# Patient Record
Sex: Female | Born: 1980 | Race: Black or African American | Hispanic: No | Marital: Single | Smoking: Current every day smoker
Health system: Southern US, Community
[De-identification: ages and names within clinical notes are randomized; demographics above are authoritative.]

## PROBLEM LIST (undated history)

## (undated) DIAGNOSIS — E039 Hypothyroidism, unspecified: Secondary | ICD-10-CM

## (undated) DIAGNOSIS — A599 Trichomoniasis, unspecified: Secondary | ICD-10-CM

## (undated) DIAGNOSIS — K8020 Calculus of gallbladder without cholecystitis without obstruction: Secondary | ICD-10-CM

## (undated) DIAGNOSIS — D649 Anemia, unspecified: Secondary | ICD-10-CM

## (undated) DIAGNOSIS — I10 Essential (primary) hypertension: Secondary | ICD-10-CM

## (undated) DIAGNOSIS — D539 Nutritional anemia, unspecified: Secondary | ICD-10-CM

## (undated) DIAGNOSIS — A749 Chlamydial infection, unspecified: Secondary | ICD-10-CM

## (undated) DIAGNOSIS — F1020 Alcohol dependence, uncomplicated: Secondary | ICD-10-CM

## (undated) DIAGNOSIS — A549 Gonococcal infection, unspecified: Secondary | ICD-10-CM

## (undated) DIAGNOSIS — N83209 Unspecified ovarian cyst, unspecified side: Secondary | ICD-10-CM

## (undated) DIAGNOSIS — K859 Acute pancreatitis without necrosis or infection, unspecified: Secondary | ICD-10-CM

## (undated) DIAGNOSIS — IMO0002 Reserved for concepts with insufficient information to code with codable children: Secondary | ICD-10-CM

## (undated) DIAGNOSIS — R87619 Unspecified abnormal cytological findings in specimens from cervix uteri: Secondary | ICD-10-CM

## (undated) DIAGNOSIS — K760 Fatty (change of) liver, not elsewhere classified: Secondary | ICD-10-CM

## (undated) HISTORY — DX: Calculus of gallbladder without cholecystitis without obstruction: K80.20

## (undated) HISTORY — DX: Fatty (change of) liver, not elsewhere classified: K76.0

## (undated) HISTORY — DX: Nutritional anemia, unspecified: D53.9

## (undated) HISTORY — DX: Alcohol dependence, uncomplicated: F10.20

## (undated) HISTORY — DX: Reserved for concepts with insufficient information to code with codable children: IMO0002

## (undated) HISTORY — DX: Acute pancreatitis without necrosis or infection, unspecified: K85.90

## (undated) HISTORY — DX: Hypothyroidism, unspecified: E03.9

## (undated) HISTORY — DX: Anemia, unspecified: D64.9

## (undated) HISTORY — PX: WISDOM TOOTH EXTRACTION: SHX21

## (undated) HISTORY — DX: Unspecified abnormal cytological findings in specimens from cervix uteri: R87.619

---

## 1999-04-25 ENCOUNTER — Inpatient Hospital Stay (HOSPITAL_COMMUNITY): Admission: AD | Admit: 1999-04-25 | Discharge: 1999-04-25 | Payer: Self-pay | Admitting: *Deleted

## 1999-04-29 ENCOUNTER — Inpatient Hospital Stay (HOSPITAL_COMMUNITY): Admission: AD | Admit: 1999-04-29 | Discharge: 1999-04-29 | Payer: Self-pay | Admitting: Obstetrics & Gynecology

## 1999-04-29 ENCOUNTER — Encounter: Payer: Self-pay | Admitting: *Deleted

## 1999-12-01 ENCOUNTER — Inpatient Hospital Stay (HOSPITAL_COMMUNITY): Admission: AD | Admit: 1999-12-01 | Discharge: 1999-12-01 | Payer: Self-pay | Admitting: *Deleted

## 2000-05-19 ENCOUNTER — Emergency Department (HOSPITAL_COMMUNITY): Admission: EM | Admit: 2000-05-19 | Discharge: 2000-05-19 | Payer: Self-pay | Admitting: Emergency Medicine

## 2000-07-08 ENCOUNTER — Emergency Department (HOSPITAL_COMMUNITY): Admission: EM | Admit: 2000-07-08 | Discharge: 2000-07-08 | Payer: Self-pay | Admitting: Emergency Medicine

## 2000-07-30 ENCOUNTER — Emergency Department (HOSPITAL_COMMUNITY): Admission: EM | Admit: 2000-07-30 | Discharge: 2000-07-30 | Payer: Self-pay

## 2000-07-30 ENCOUNTER — Encounter: Payer: Self-pay | Admitting: Emergency Medicine

## 2000-09-25 ENCOUNTER — Emergency Department (HOSPITAL_COMMUNITY): Admission: EM | Admit: 2000-09-25 | Discharge: 2000-09-25 | Payer: Self-pay | Admitting: Emergency Medicine

## 2000-09-25 ENCOUNTER — Encounter: Payer: Self-pay | Admitting: Emergency Medicine

## 2001-05-19 ENCOUNTER — Emergency Department (HOSPITAL_COMMUNITY): Admission: EM | Admit: 2001-05-19 | Discharge: 2001-05-19 | Payer: Self-pay | Admitting: Emergency Medicine

## 2001-05-19 ENCOUNTER — Encounter: Payer: Self-pay | Admitting: Emergency Medicine

## 2001-09-08 ENCOUNTER — Emergency Department (HOSPITAL_COMMUNITY): Admission: EM | Admit: 2001-09-08 | Discharge: 2001-09-08 | Payer: Self-pay | Admitting: Emergency Medicine

## 2001-09-13 ENCOUNTER — Emergency Department (HOSPITAL_COMMUNITY): Admission: EM | Admit: 2001-09-13 | Discharge: 2001-09-13 | Payer: Self-pay | Admitting: Emergency Medicine

## 2001-09-13 ENCOUNTER — Encounter: Payer: Self-pay | Admitting: Emergency Medicine

## 2001-09-16 ENCOUNTER — Encounter: Admission: RE | Admit: 2001-09-16 | Discharge: 2001-09-16 | Payer: Self-pay | Admitting: Internal Medicine

## 2001-10-25 ENCOUNTER — Emergency Department (HOSPITAL_COMMUNITY): Admission: EM | Admit: 2001-10-25 | Discharge: 2001-10-25 | Payer: Self-pay | Admitting: Emergency Medicine

## 2001-10-31 ENCOUNTER — Emergency Department (HOSPITAL_COMMUNITY): Admission: EM | Admit: 2001-10-31 | Discharge: 2001-10-31 | Payer: Self-pay | Admitting: Emergency Medicine

## 2002-03-30 ENCOUNTER — Emergency Department (HOSPITAL_COMMUNITY): Admission: EM | Admit: 2002-03-30 | Discharge: 2002-03-30 | Payer: Self-pay | Admitting: Emergency Medicine

## 2002-04-05 ENCOUNTER — Emergency Department (HOSPITAL_COMMUNITY): Admission: EM | Admit: 2002-04-05 | Discharge: 2002-04-05 | Payer: Self-pay | Admitting: Emergency Medicine

## 2002-08-19 ENCOUNTER — Emergency Department (HOSPITAL_COMMUNITY): Admission: EM | Admit: 2002-08-19 | Discharge: 2002-08-19 | Payer: Self-pay | Admitting: Emergency Medicine

## 2002-08-21 ENCOUNTER — Emergency Department (HOSPITAL_COMMUNITY): Admission: EM | Admit: 2002-08-21 | Discharge: 2002-08-21 | Payer: Self-pay | Admitting: Emergency Medicine

## 2003-03-07 ENCOUNTER — Emergency Department (HOSPITAL_COMMUNITY): Admission: EM | Admit: 2003-03-07 | Discharge: 2003-03-07 | Payer: Self-pay | Admitting: Emergency Medicine

## 2003-06-09 ENCOUNTER — Emergency Department (HOSPITAL_COMMUNITY): Admission: EM | Admit: 2003-06-09 | Discharge: 2003-06-09 | Payer: Self-pay | Admitting: Emergency Medicine

## 2003-06-10 ENCOUNTER — Emergency Department (HOSPITAL_COMMUNITY): Admission: EM | Admit: 2003-06-10 | Discharge: 2003-06-10 | Payer: Self-pay | Admitting: Emergency Medicine

## 2003-06-29 ENCOUNTER — Emergency Department (HOSPITAL_COMMUNITY): Admission: EM | Admit: 2003-06-29 | Discharge: 2003-06-29 | Payer: Self-pay | Admitting: Emergency Medicine

## 2003-08-22 ENCOUNTER — Emergency Department (HOSPITAL_COMMUNITY): Admission: EM | Admit: 2003-08-22 | Discharge: 2003-08-22 | Payer: Self-pay | Admitting: Emergency Medicine

## 2003-09-28 ENCOUNTER — Emergency Department (HOSPITAL_COMMUNITY): Admission: EM | Admit: 2003-09-28 | Discharge: 2003-09-28 | Payer: Self-pay | Admitting: Emergency Medicine

## 2003-11-30 ENCOUNTER — Encounter (HOSPITAL_COMMUNITY): Admission: RE | Admit: 2003-11-30 | Discharge: 2004-02-28 | Payer: Self-pay | Admitting: Endocrinology

## 2003-12-08 ENCOUNTER — Emergency Department (HOSPITAL_COMMUNITY): Admission: EM | Admit: 2003-12-08 | Discharge: 2003-12-08 | Payer: Self-pay | Admitting: Emergency Medicine

## 2004-03-14 ENCOUNTER — Emergency Department (HOSPITAL_COMMUNITY): Admission: EM | Admit: 2004-03-14 | Discharge: 2004-03-14 | Payer: Self-pay | Admitting: Emergency Medicine

## 2004-10-16 ENCOUNTER — Emergency Department (HOSPITAL_COMMUNITY): Admission: EM | Admit: 2004-10-16 | Discharge: 2004-10-16 | Payer: Self-pay | Admitting: Emergency Medicine

## 2004-11-13 ENCOUNTER — Emergency Department (HOSPITAL_COMMUNITY): Admission: EM | Admit: 2004-11-13 | Discharge: 2004-11-13 | Payer: Self-pay | Admitting: Emergency Medicine

## 2007-03-08 ENCOUNTER — Emergency Department (HOSPITAL_COMMUNITY): Admission: EM | Admit: 2007-03-08 | Discharge: 2007-03-08 | Payer: Self-pay | Admitting: Emergency Medicine

## 2009-01-29 ENCOUNTER — Ambulatory Visit (HOSPITAL_COMMUNITY): Admission: RE | Admit: 2009-01-29 | Discharge: 2009-01-29 | Payer: Self-pay | Admitting: Obstetrics & Gynecology

## 2009-02-22 ENCOUNTER — Ambulatory Visit: Payer: Self-pay | Admitting: Obstetrics & Gynecology

## 2009-02-22 LAB — CONVERTED CEMR LAB: Chlamydia, DNA Probe: POSITIVE — AB

## 2009-03-08 ENCOUNTER — Ambulatory Visit: Payer: Self-pay | Admitting: Obstetrics & Gynecology

## 2009-03-22 ENCOUNTER — Encounter: Payer: Self-pay | Admitting: Obstetrics & Gynecology

## 2009-03-22 ENCOUNTER — Ambulatory Visit: Payer: Self-pay | Admitting: Family Medicine

## 2009-03-22 LAB — CONVERTED CEMR LAB
Chlamydia, DNA Probe: NEGATIVE
GC Probe Amp, Genital: NEGATIVE
HCT: 31.8 % — ABNORMAL LOW (ref 36.0–46.0)
Hemoglobin: 10.6 g/dL — ABNORMAL LOW (ref 12.0–15.0)
MCHC: 33.3 g/dL (ref 30.0–36.0)
MCV: 91.9 fL (ref 78.0–100.0)
Platelets: 171 10*3/uL (ref 150–400)
RBC: 3.46 M/uL — ABNORMAL LOW (ref 3.87–5.11)
RDW: 13.6 % (ref 11.5–15.5)
TSH: 4.243 microintl units/mL (ref 0.350–4.500)
WBC: 7.7 10*3/uL (ref 4.0–10.5)

## 2009-04-05 ENCOUNTER — Ambulatory Visit: Payer: Self-pay | Admitting: Obstetrics & Gynecology

## 2009-04-19 ENCOUNTER — Ambulatory Visit: Payer: Self-pay | Admitting: Obstetrics and Gynecology

## 2009-04-19 LAB — CONVERTED CEMR LAB: TSH: 2.791 microintl units/mL (ref 0.350–4.500)

## 2009-05-03 ENCOUNTER — Ambulatory Visit: Payer: Self-pay | Admitting: Family Medicine

## 2009-05-17 ENCOUNTER — Ambulatory Visit: Payer: Self-pay | Admitting: Obstetrics & Gynecology

## 2009-05-17 LAB — CONVERTED CEMR LAB
Chlamydia, DNA Probe: NEGATIVE
GC Probe Amp, Genital: NEGATIVE

## 2009-05-18 ENCOUNTER — Encounter: Payer: Self-pay | Admitting: Obstetrics & Gynecology

## 2009-05-18 ENCOUNTER — Ambulatory Visit (HOSPITAL_COMMUNITY): Admission: RE | Admit: 2009-05-18 | Discharge: 2009-05-18 | Payer: Self-pay | Admitting: Obstetrics & Gynecology

## 2009-05-24 ENCOUNTER — Ambulatory Visit: Payer: Self-pay | Admitting: Obstetrics & Gynecology

## 2009-05-31 ENCOUNTER — Ambulatory Visit: Payer: Self-pay | Admitting: Obstetrics and Gynecology

## 2009-06-02 ENCOUNTER — Ambulatory Visit: Payer: Self-pay | Admitting: Advanced Practice Midwife

## 2010-02-14 ENCOUNTER — Inpatient Hospital Stay (HOSPITAL_COMMUNITY): Admission: AD | Admit: 2010-02-14 | Discharge: 2009-06-04 | Payer: Self-pay | Admitting: Obstetrics & Gynecology

## 2010-03-10 NOTE — L&D Delivery Note (Cosign Needed)
Delivery Note At 3:01 AM a viable female infant was delivered via Vaginal, Spontaneous Delivery (Presentation: Right Occiput Anterior).  APGAR: 8, 9; weight 5# 15oz .   Placenta status: Intact, Spontaneous.  Cord: 3 vessels with the following complications: None.    Anesthesia: Epidural  Episiotomy: None Lacerations: Intact Suture Repair: N/A Est. Blood Loss (mL): 250  Mom to postpartum.  Baby to nursery-stable.  Philipp Deputy, CNM, present for delivery.  Sharen Counter 10/30/2010, 3:17 AM

## 2010-03-24 ENCOUNTER — Inpatient Hospital Stay (HOSPITAL_COMMUNITY)
Admission: AD | Admit: 2010-03-24 | Discharge: 2010-03-24 | Payer: Self-pay | Source: Home / Self Care | Attending: Family Medicine | Admitting: Family Medicine

## 2010-03-25 LAB — WET PREP, GENITAL
Clue Cells Wet Prep HPF POC: NONE SEEN
Trich, Wet Prep: NONE SEEN
Yeast Wet Prep HPF POC: NONE SEEN

## 2010-03-25 LAB — CBC
HCT: 32.9 % — ABNORMAL LOW (ref 36.0–46.0)
Hemoglobin: 10.8 g/dL — ABNORMAL LOW (ref 12.0–15.0)
MCH: 26.5 pg (ref 26.0–34.0)
MCHC: 32.8 g/dL (ref 30.0–36.0)
MCV: 80.6 fL (ref 78.0–100.0)
Platelets: 236 10*3/uL (ref 150–400)
RBC: 4.08 MIL/uL (ref 3.87–5.11)
RDW: 15.5 % (ref 11.5–15.5)
WBC: 6.8 10*3/uL (ref 4.0–10.5)

## 2010-03-25 LAB — DIFFERENTIAL
Basophils Absolute: 0 10*3/uL (ref 0.0–0.1)
Basophils Relative: 0 % (ref 0–1)
Eosinophils Absolute: 0.2 10*3/uL (ref 0.0–0.7)
Eosinophils Relative: 3 % (ref 0–5)
Lymphocytes Relative: 24 % (ref 12–46)
Lymphs Abs: 1.6 10*3/uL (ref 0.7–4.0)
Monocytes Absolute: 0.6 10*3/uL (ref 0.1–1.0)
Monocytes Relative: 9 % (ref 3–12)
Neutro Abs: 4.4 10*3/uL (ref 1.7–7.7)
Neutrophils Relative %: 65 % (ref 43–77)

## 2010-03-25 LAB — URINALYSIS, ROUTINE W REFLEX MICROSCOPIC
Bilirubin Urine: NEGATIVE
Hgb urine dipstick: NEGATIVE
Ketones, ur: 15 mg/dL — AB
Nitrite: NEGATIVE
Protein, ur: NEGATIVE mg/dL
Specific Gravity, Urine: 1.015 (ref 1.005–1.030)
Urine Glucose, Fasting: NEGATIVE mg/dL
Urobilinogen, UA: 1 mg/dL (ref 0.0–1.0)
pH: 6.5 (ref 5.0–8.0)

## 2010-03-25 LAB — POCT PREGNANCY, URINE: Preg Test, Ur: POSITIVE

## 2010-03-27 LAB — GC/CHLAMYDIA PROBE AMP, GENITAL
Chlamydia, DNA Probe: NEGATIVE
GC Probe Amp, Genital: NEGATIVE

## 2010-05-13 ENCOUNTER — Other Ambulatory Visit: Payer: Self-pay | Admitting: Family Medicine

## 2010-05-13 DIAGNOSIS — F17200 Nicotine dependence, unspecified, uncomplicated: Secondary | ICD-10-CM

## 2010-05-13 DIAGNOSIS — Z3689 Encounter for other specified antenatal screening: Secondary | ICD-10-CM

## 2010-05-23 ENCOUNTER — Other Ambulatory Visit: Payer: Self-pay | Admitting: Family Medicine

## 2010-05-23 ENCOUNTER — Ambulatory Visit (HOSPITAL_COMMUNITY)
Admission: RE | Admit: 2010-05-23 | Discharge: 2010-05-23 | Disposition: A | Discharge status: Home / Self Care | Payer: Medicaid Other | Source: Ambulatory Visit | Attending: Obstetrics & Gynecology | Admitting: Obstetrics & Gynecology

## 2010-05-23 ENCOUNTER — Other Ambulatory Visit: Payer: Self-pay

## 2010-05-23 DIAGNOSIS — M899 Disorder of bone, unspecified: Secondary | ICD-10-CM

## 2010-05-23 DIAGNOSIS — F17200 Nicotine dependence, unspecified, uncomplicated: Secondary | ICD-10-CM

## 2010-05-23 DIAGNOSIS — M259 Joint disorder, unspecified: Secondary | ICD-10-CM

## 2010-05-23 DIAGNOSIS — O9989 Other specified diseases and conditions complicating pregnancy, childbirth and the puerperium: Secondary | ICD-10-CM

## 2010-05-23 DIAGNOSIS — Z3689 Encounter for other specified antenatal screening: Secondary | ICD-10-CM

## 2010-05-23 LAB — HIV ANTIBODY (ROUTINE TESTING W REFLEX): HIV: NONREACTIVE

## 2010-05-23 LAB — ANTIBODY SCREEN: Antibody Screen: NEGATIVE

## 2010-05-23 LAB — POCT URINALYSIS DIPSTICK
Bilirubin Urine: NEGATIVE
Glucose, UA: NEGATIVE mg/dL
Hgb urine dipstick: NEGATIVE
Ketones, ur: NEGATIVE mg/dL
Nitrite: NEGATIVE
Protein, ur: NEGATIVE mg/dL
Specific Gravity, Urine: 1.025 (ref 1.005–1.030)
Urobilinogen, UA: 0.2 mg/dL (ref 0.0–1.0)
pH: 6.5 (ref 5.0–8.0)

## 2010-05-23 LAB — HEPATITIS B SURFACE ANTIGEN: Hepatitis B Surface Ag: NEGATIVE

## 2010-05-26 LAB — POCT URINALYSIS DIP (DEVICE)
Bilirubin Urine: NEGATIVE
Glucose, UA: NEGATIVE mg/dL
Hgb urine dipstick: NEGATIVE
Ketones, ur: NEGATIVE mg/dL
Nitrite: NEGATIVE
Protein, ur: NEGATIVE mg/dL
Specific Gravity, Urine: 1.02 (ref 1.005–1.030)
Urobilinogen, UA: 0.2 mg/dL (ref 0.0–1.0)
pH: 6.5 (ref 5.0–8.0)

## 2010-05-27 LAB — POCT URINALYSIS DIP (DEVICE)
Bilirubin Urine: NEGATIVE
Glucose, UA: NEGATIVE mg/dL
Hgb urine dipstick: NEGATIVE
Ketones, ur: NEGATIVE mg/dL
Nitrite: NEGATIVE
Protein, ur: NEGATIVE mg/dL
Specific Gravity, Urine: 1.015 (ref 1.005–1.030)
Urobilinogen, UA: 0.2 mg/dL (ref 0.0–1.0)
pH: 6.5 (ref 5.0–8.0)

## 2010-05-30 ENCOUNTER — Inpatient Hospital Stay (HOSPITAL_COMMUNITY)
Admission: AD | Admit: 2010-05-30 | Discharge: 2010-05-30 | Disposition: A | Discharge status: Home / Self Care | Payer: Medicaid Other | Source: Ambulatory Visit | Attending: Obstetrics and Gynecology | Admitting: Obstetrics and Gynecology

## 2010-05-30 DIAGNOSIS — K5289 Other specified noninfective gastroenteritis and colitis: Secondary | ICD-10-CM | POA: Insufficient documentation

## 2010-05-30 DIAGNOSIS — O9989 Other specified diseases and conditions complicating pregnancy, childbirth and the puerperium: Secondary | ICD-10-CM

## 2010-05-30 DIAGNOSIS — O212 Late vomiting of pregnancy: Secondary | ICD-10-CM | POA: Insufficient documentation

## 2010-05-30 DIAGNOSIS — O99891 Other specified diseases and conditions complicating pregnancy: Secondary | ICD-10-CM | POA: Insufficient documentation

## 2010-05-30 LAB — URINALYSIS, ROUTINE W REFLEX MICROSCOPIC
Bilirubin Urine: NEGATIVE
Glucose, UA: NEGATIVE mg/dL
Hgb urine dipstick: NEGATIVE
Ketones, ur: 40 mg/dL — AB
Nitrite: NEGATIVE
Protein, ur: NEGATIVE mg/dL
Specific Gravity, Urine: >1.03 — ABNORMAL HIGH (ref 1.005–1.030)
Urobilinogen, UA: 0.2 mg/dL (ref 0.0–1.0)
pH: 6 (ref 5.0–8.0)

## 2010-05-30 LAB — CBC
HCT: 32.7 % — ABNORMAL LOW (ref 36.0–46.0)
Hemoglobin: 10.9 g/dL — ABNORMAL LOW (ref 12.0–15.0)
MCH: 29 pg (ref 26.0–34.0)
MCHC: 33.3 g/dL (ref 30.0–36.0)
MCV: 87 fL (ref 78.0–100.0)
Platelets: 183 10*3/uL (ref 150–400)
RBC: 3.76 MIL/uL — ABNORMAL LOW (ref 3.87–5.11)
RDW: 16.8 % — ABNORMAL HIGH (ref 11.5–15.5)
WBC: 10 10*3/uL (ref 4.0–10.5)

## 2010-05-30 LAB — COMPREHENSIVE METABOLIC PANEL
ALT: 30 U/L (ref 0–35)
AST: 26 U/L (ref 0–37)
Albumin: 3.6 g/dL (ref 3.5–5.2)
Alkaline Phosphatase: 73 U/L (ref 39–117)
BUN: 9 mg/dL (ref 6–23)
CO2: 23 mEq/L (ref 19–32)
Calcium: 8.8 mg/dL (ref 8.4–10.5)
Chloride: 103 mEq/L (ref 96–112)
Creatinine, Ser: 0.7 mg/dL (ref 0.4–1.2)
GFR calc Af Amer: >60 mL/min (ref >60)
GFR calc non Af Amer: >60 mL/min (ref >60)
Glucose, Bld: 96 mg/dL (ref 70–99)
Potassium: 3.6 mEq/L (ref 3.5–5.1)
Sodium: 133 mEq/L — ABNORMAL LOW (ref 135–145)
Total Bilirubin: 0.2 mg/dL — ABNORMAL LOW (ref 0.3–1.2)
Total Protein: 7.1 g/dL (ref 6.0–8.3)

## 2010-05-31 LAB — POCT URINALYSIS DIP (DEVICE)
Bilirubin Urine: NEGATIVE
Bilirubin Urine: NEGATIVE
Glucose, UA: NEGATIVE mg/dL
Glucose, UA: NEGATIVE mg/dL
Hgb urine dipstick: NEGATIVE
Hgb urine dipstick: NEGATIVE
Ketones, ur: NEGATIVE mg/dL
Ketones, ur: NEGATIVE mg/dL
Nitrite: NEGATIVE
Nitrite: NEGATIVE
Protein, ur: NEGATIVE mg/dL
Protein, ur: NEGATIVE mg/dL
Specific Gravity, Urine: 1.02 (ref 1.005–1.030)
Specific Gravity, Urine: 1.02 (ref 1.005–1.030)
Urobilinogen, UA: 0.2 mg/dL (ref 0.0–1.0)
Urobilinogen, UA: 0.2 mg/dL (ref 0.0–1.0)
pH: 6 (ref 5.0–8.0)
pH: 6 (ref 5.0–8.0)

## 2010-06-03 ENCOUNTER — Ambulatory Visit (HOSPITAL_COMMUNITY): Payer: Medicaid Other

## 2010-06-03 LAB — POCT URINALYSIS DIP (DEVICE)
Bilirubin Urine: NEGATIVE
Bilirubin Urine: NEGATIVE
Bilirubin Urine: NEGATIVE
Glucose, UA: NEGATIVE mg/dL
Glucose, UA: NEGATIVE mg/dL
Glucose, UA: NEGATIVE mg/dL
Hgb urine dipstick: NEGATIVE
Hgb urine dipstick: NEGATIVE
Ketones, ur: NEGATIVE mg/dL
Ketones, ur: NEGATIVE mg/dL
Nitrite: NEGATIVE
Nitrite: NEGATIVE
Protein, ur: NEGATIVE mg/dL
Specific Gravity, Urine: 1.01 (ref 1.005–1.030)
Specific Gravity, Urine: 1.015 (ref 1.005–1.030)
Specific Gravity, Urine: 1.02 (ref 1.005–1.030)
Urobilinogen, UA: 0.2 mg/dL (ref 0.0–1.0)
pH: 6.5 (ref 5.0–8.0)

## 2010-06-03 LAB — CBC
Hemoglobin: 11 g/dL — ABNORMAL LOW (ref 12.0–15.0)
MCHC: 33.6 g/dL (ref 30.0–36.0)
MCV: 88.5 fL (ref 78.0–100.0)
RBC: 3.71 MIL/uL — ABNORMAL LOW (ref 3.87–5.11)
WBC: 7.9 10*3/uL (ref 4.0–10.5)

## 2010-06-06 ENCOUNTER — Encounter (HOSPITAL_COMMUNITY): Payer: Self-pay

## 2010-06-06 ENCOUNTER — Other Ambulatory Visit: Payer: Self-pay | Admitting: Family Medicine

## 2010-06-06 ENCOUNTER — Ambulatory Visit (HOSPITAL_COMMUNITY)
Admission: RE | Admit: 2010-06-06 | Discharge: 2010-06-06 | Disposition: A | Discharge status: Home / Self Care | Payer: Medicaid Other | Source: Ambulatory Visit | Attending: Family Medicine | Admitting: Family Medicine

## 2010-06-06 DIAGNOSIS — E079 Disorder of thyroid, unspecified: Secondary | ICD-10-CM | POA: Insufficient documentation

## 2010-06-06 DIAGNOSIS — Z3689 Encounter for other specified antenatal screening: Secondary | ICD-10-CM

## 2010-06-06 DIAGNOSIS — O99330 Smoking (tobacco) complicating pregnancy, unspecified trimester: Secondary | ICD-10-CM | POA: Insufficient documentation

## 2010-06-06 DIAGNOSIS — F17200 Nicotine dependence, unspecified, uncomplicated: Secondary | ICD-10-CM

## 2010-06-06 DIAGNOSIS — O99280 Endocrine, nutritional and metabolic diseases complicating pregnancy, unspecified trimester: Secondary | ICD-10-CM | POA: Insufficient documentation

## 2010-06-06 DIAGNOSIS — E669 Obesity, unspecified: Secondary | ICD-10-CM | POA: Insufficient documentation

## 2010-06-06 DIAGNOSIS — O358XX0 Maternal care for other (suspected) fetal abnormality and damage, not applicable or unspecified: Secondary | ICD-10-CM | POA: Insufficient documentation

## 2010-06-10 LAB — POCT URINALYSIS DIP (DEVICE)
Bilirubin Urine: NEGATIVE
Glucose, UA: NEGATIVE mg/dL
Hgb urine dipstick: NEGATIVE
Hgb urine dipstick: NEGATIVE
Ketones, ur: NEGATIVE mg/dL
Nitrite: NEGATIVE
Specific Gravity, Urine: 1.02 (ref 1.005–1.030)
Specific Gravity, Urine: 1.025 (ref 1.005–1.030)
Urobilinogen, UA: 0.2 mg/dL (ref 0.0–1.0)
pH: 6 (ref 5.0–8.0)

## 2010-06-20 ENCOUNTER — Other Ambulatory Visit: Payer: Self-pay | Admitting: Obstetrics & Gynecology

## 2010-06-20 DIAGNOSIS — Z331 Pregnant state, incidental: Secondary | ICD-10-CM

## 2010-06-20 DIAGNOSIS — O9989 Other specified diseases and conditions complicating pregnancy, childbirth and the puerperium: Secondary | ICD-10-CM

## 2010-06-20 DIAGNOSIS — M899 Disorder of bone, unspecified: Secondary | ICD-10-CM

## 2010-06-20 DIAGNOSIS — M259 Joint disorder, unspecified: Secondary | ICD-10-CM

## 2010-06-20 LAB — POCT URINALYSIS DIP (DEVICE)
Hgb urine dipstick: NEGATIVE
Nitrite: NEGATIVE
Protein, ur: 30 mg/dL — AB
Urobilinogen, UA: 2 mg/dL — ABNORMAL HIGH (ref 0.0–1.0)
pH: 6.5 (ref 5.0–8.0)

## 2010-07-17 ENCOUNTER — Other Ambulatory Visit: Payer: Self-pay | Admitting: Obstetrics and Gynecology

## 2010-07-17 DIAGNOSIS — E0590 Thyrotoxicosis, unspecified without thyrotoxic crisis or storm: Secondary | ICD-10-CM

## 2010-07-17 DIAGNOSIS — O99330 Smoking (tobacco) complicating pregnancy, unspecified trimester: Secondary | ICD-10-CM

## 2010-07-17 DIAGNOSIS — Z331 Pregnant state, incidental: Secondary | ICD-10-CM

## 2010-07-17 LAB — POCT URINALYSIS DIP (DEVICE)
Ketones, ur: NEGATIVE mg/dL
Nitrite: NEGATIVE
Protein, ur: NEGATIVE mg/dL
Urobilinogen, UA: 0.2 mg/dL (ref 0.0–1.0)
pH: 7 (ref 5.0–8.0)

## 2010-07-18 ENCOUNTER — Ambulatory Visit (HOSPITAL_COMMUNITY)
Admission: RE | Admit: 2010-07-18 | Discharge: 2010-07-18 | Disposition: A | Discharge status: Home / Self Care | Payer: Medicaid Other | Source: Ambulatory Visit | Attending: Family Medicine | Admitting: Family Medicine

## 2010-07-18 DIAGNOSIS — O3510X0 Maternal care for (suspected) chromosomal abnormality in fetus, unspecified, not applicable or unspecified: Secondary | ICD-10-CM | POA: Insufficient documentation

## 2010-07-18 DIAGNOSIS — O99330 Smoking (tobacco) complicating pregnancy, unspecified trimester: Secondary | ICD-10-CM | POA: Insufficient documentation

## 2010-07-18 DIAGNOSIS — F17200 Nicotine dependence, unspecified, uncomplicated: Secondary | ICD-10-CM

## 2010-07-18 DIAGNOSIS — O351XX0 Maternal care for (suspected) chromosomal abnormality in fetus, not applicable or unspecified: Secondary | ICD-10-CM | POA: Insufficient documentation

## 2010-07-18 DIAGNOSIS — Z3689 Encounter for other specified antenatal screening: Secondary | ICD-10-CM

## 2010-07-18 DIAGNOSIS — O99210 Obesity complicating pregnancy, unspecified trimester: Secondary | ICD-10-CM | POA: Insufficient documentation

## 2010-07-18 DIAGNOSIS — E669 Obesity, unspecified: Secondary | ICD-10-CM | POA: Insufficient documentation

## 2010-07-26 NOTE — Consult Note (Signed)
   NAME:  Marissa Doyle, Marissa Doyle                           ACCOUNT NO.:  [id number]   MEDICAL RECORD NO.:  [id number]                   PATIENT TYPE:  EMS   LOCATION:  [facility]                                 FACILITY:  [facility]   PHYSICIAN:  Jefry H. Pollyann Kennedy, M.D.                DATE OF BIRTH:  Dec 25, 1980   DATE OF CONSULTATION:  04/05/2002  DATE OF DISCHARGE:                                   CONSULTATION   REASON FOR CONSULTATION:  Peritonsillar abscess and severe sore throat.   HISTORY:  This is a 30 year old lady with a one week history of severe left  sided sore throat and ear pain.  She had been evaluated by Dr. __________  in office last week and was getting better on antibiotics and decision was  made to continue with the antibiotics and not attempt any surgical  intervention.  She started getting worse again over the weekend and called  me early this morning complaining of severe throat pain again.  I instructed  her to come immediately to the emergency department and she has done that.   PAST MEDICAL HISTORY:  Negative.   PAST SURGICAL HISTORY:  Negative.   SOCIAL HISTORY:  She is a half pack per day smoker, no alcohol or drug use.   PHYSICAL EXAMINATION:  GENERAL:  Healthy appearing lady, somewhat ill  appearing, in no respiratory distress.  She does have a muffled voice and  she does have some trismus.  NECK:  Cervical examination reveals tender adenopathy, left upper jugular  pain.  She has mild trismus.  NEURO/MOTOR:  Deferred.  HEENT:  Oral cavity and pharynx reveals fullness and swelling of the soft  palate, displacement of the left tonsil towards the midline.  Erythema and  significant tenderness of the soft palate mucosa.   PROCEDURE:  1% Xylocaine with epinephrine was infiltrated in to the soft  palate mucosa.  An 18 gauge needle was entered in to the peritonsillar space  and about 3 cc of purulent material was aspirated.  A #11 scalpel was then  used to excise the mucosa and  a long hemostat was used to separate the  tissues around the tonsil capsule.  She tolerated this well.   DISCHARGE INSTRUCTIONS:  She is instructed to continue with her amoxicillin  and her narcotic pain killers and to follow up on Friday or contact me by  Thursday if she starts getting worse again.                                               Jefry H. Pollyann Kennedy, M.D.    JHR/MEDQ  D:  04/05/2002  T:  04/05/2002  Job:  [id number]

## 2010-08-14 ENCOUNTER — Other Ambulatory Visit: Payer: Self-pay | Admitting: Obstetrics and Gynecology

## 2010-08-14 DIAGNOSIS — O99330 Smoking (tobacco) complicating pregnancy, unspecified trimester: Secondary | ICD-10-CM

## 2010-08-14 DIAGNOSIS — IMO0002 Reserved for concepts with insufficient information to code with codable children: Secondary | ICD-10-CM

## 2010-08-14 DIAGNOSIS — E0590 Thyrotoxicosis, unspecified without thyrotoxic crisis or storm: Secondary | ICD-10-CM

## 2010-08-14 DIAGNOSIS — N83209 Unspecified ovarian cyst, unspecified side: Secondary | ICD-10-CM

## 2010-08-14 LAB — POCT URINALYSIS DIP (DEVICE)
Nitrite: NEGATIVE
Protein, ur: NEGATIVE mg/dL
Specific Gravity, Urine: 1.025 (ref 1.005–1.030)
Urobilinogen, UA: 0.2 mg/dL (ref 0.0–1.0)

## 2010-08-28 ENCOUNTER — Other Ambulatory Visit: Payer: Self-pay | Admitting: Obstetrics and Gynecology

## 2010-08-28 DIAGNOSIS — E0590 Thyrotoxicosis, unspecified without thyrotoxic crisis or storm: Secondary | ICD-10-CM

## 2010-08-28 DIAGNOSIS — O99330 Smoking (tobacco) complicating pregnancy, unspecified trimester: Secondary | ICD-10-CM

## 2010-08-28 DIAGNOSIS — IMO0002 Reserved for concepts with insufficient information to code with codable children: Secondary | ICD-10-CM

## 2010-08-28 DIAGNOSIS — N83209 Unspecified ovarian cyst, unspecified side: Secondary | ICD-10-CM

## 2010-08-28 LAB — POCT URINALYSIS DIP (DEVICE)
Glucose, UA: NEGATIVE mg/dL
Hgb urine dipstick: NEGATIVE
Nitrite: NEGATIVE
Urobilinogen, UA: 0.2 mg/dL (ref 0.0–1.0)

## 2010-09-12 ENCOUNTER — Other Ambulatory Visit: Payer: Self-pay | Admitting: Obstetrics and Gynecology

## 2010-09-12 DIAGNOSIS — IMO0002 Reserved for concepts with insufficient information to code with codable children: Secondary | ICD-10-CM

## 2010-09-12 DIAGNOSIS — E0590 Thyrotoxicosis, unspecified without thyrotoxic crisis or storm: Secondary | ICD-10-CM

## 2010-09-12 DIAGNOSIS — O99330 Smoking (tobacco) complicating pregnancy, unspecified trimester: Secondary | ICD-10-CM

## 2010-09-12 DIAGNOSIS — N83209 Unspecified ovarian cyst, unspecified side: Secondary | ICD-10-CM

## 2010-09-12 LAB — POCT URINALYSIS DIP (DEVICE)
Bilirubin Urine: NEGATIVE
Nitrite: NEGATIVE
Protein, ur: 30 mg/dL — AB
pH: 7 (ref 5.0–8.0)

## 2010-09-26 ENCOUNTER — Other Ambulatory Visit: Payer: Self-pay | Admitting: Family Medicine

## 2010-09-26 DIAGNOSIS — IMO0002 Reserved for concepts with insufficient information to code with codable children: Secondary | ICD-10-CM

## 2010-09-26 DIAGNOSIS — E0590 Thyrotoxicosis, unspecified without thyrotoxic crisis or storm: Secondary | ICD-10-CM

## 2010-09-26 LAB — POCT URINALYSIS DIP (DEVICE)
Ketones, ur: NEGATIVE mg/dL
Leukocytes, UA: NEGATIVE
Protein, ur: NEGATIVE mg/dL
Urobilinogen, UA: 0.2 mg/dL (ref 0.0–1.0)

## 2010-10-10 ENCOUNTER — Other Ambulatory Visit: Payer: Self-pay | Admitting: Obstetrics and Gynecology

## 2010-10-10 DIAGNOSIS — E0590 Thyrotoxicosis, unspecified without thyrotoxic crisis or storm: Secondary | ICD-10-CM

## 2010-10-10 DIAGNOSIS — IMO0002 Reserved for concepts with insufficient information to code with codable children: Secondary | ICD-10-CM

## 2010-10-10 DIAGNOSIS — N971 Female infertility of tubal origin: Secondary | ICD-10-CM

## 2010-10-10 DIAGNOSIS — O99330 Smoking (tobacco) complicating pregnancy, unspecified trimester: Secondary | ICD-10-CM

## 2010-10-10 LAB — POCT URINALYSIS DIP (DEVICE)
Glucose, UA: NEGATIVE mg/dL
Nitrite: NEGATIVE
Urobilinogen, UA: 0.2 mg/dL (ref 0.0–1.0)

## 2010-10-11 LAB — GC/CHLAMYDIA PROBE AMP, GENITAL
Chlamydia, DNA Probe: NEGATIVE
GC Probe Amp, Genital: NEGATIVE

## 2010-10-17 ENCOUNTER — Ambulatory Visit: Payer: Medicaid Other | Admitting: Family Medicine

## 2010-10-17 DIAGNOSIS — O99280 Endocrine, nutritional and metabolic diseases complicating pregnancy, unspecified trimester: Secondary | ICD-10-CM

## 2010-10-17 DIAGNOSIS — E079 Disorder of thyroid, unspecified: Secondary | ICD-10-CM

## 2010-10-17 LAB — POCT URINALYSIS DIP (DEVICE)
Leukocytes, UA: NEGATIVE
Protein, ur: NEGATIVE mg/dL
Urobilinogen, UA: 0.2 mg/dL (ref 0.0–1.0)
pH: 7 (ref 5.0–8.0)

## 2010-10-24 ENCOUNTER — Other Ambulatory Visit: Payer: Self-pay | Admitting: Obstetrics and Gynecology

## 2010-10-24 DIAGNOSIS — E079 Disorder of thyroid, unspecified: Secondary | ICD-10-CM

## 2010-10-24 DIAGNOSIS — O99280 Endocrine, nutritional and metabolic diseases complicating pregnancy, unspecified trimester: Secondary | ICD-10-CM

## 2010-10-24 LAB — POCT URINALYSIS DIP (DEVICE)
Glucose, UA: NEGATIVE mg/dL
Nitrite: NEGATIVE
Protein, ur: NEGATIVE mg/dL
Urobilinogen, UA: 0.2 mg/dL (ref 0.0–1.0)

## 2010-10-29 ENCOUNTER — Encounter (HOSPITAL_COMMUNITY): Payer: Self-pay | Admitting: Anesthesiology

## 2010-10-29 ENCOUNTER — Inpatient Hospital Stay (HOSPITAL_COMMUNITY): Payer: Medicaid Other | Admitting: Anesthesiology

## 2010-10-29 ENCOUNTER — Encounter (HOSPITAL_COMMUNITY): Payer: Self-pay | Admitting: *Deleted

## 2010-10-29 ENCOUNTER — Inpatient Hospital Stay (HOSPITAL_COMMUNITY)
Admission: AD | Admit: 2010-10-29 | Discharge: 2010-10-31 | DRG: 775 | Disposition: A | Discharge status: Home / Self Care | Payer: Medicaid Other | Source: Ambulatory Visit | Attending: Family Medicine | Admitting: Family Medicine

## 2010-10-29 DIAGNOSIS — A549 Gonococcal infection, unspecified: Secondary | ICD-10-CM | POA: Insufficient documentation

## 2010-10-29 DIAGNOSIS — IMO0001 Reserved for inherently not codable concepts without codable children: Secondary | ICD-10-CM

## 2010-10-29 DIAGNOSIS — E079 Disorder of thyroid, unspecified: Secondary | ICD-10-CM | POA: Diagnosis present

## 2010-10-29 DIAGNOSIS — A749 Chlamydial infection, unspecified: Secondary | ICD-10-CM | POA: Insufficient documentation

## 2010-10-29 DIAGNOSIS — A599 Trichomoniasis, unspecified: Secondary | ICD-10-CM | POA: Insufficient documentation

## 2010-10-29 DIAGNOSIS — E039 Hypothyroidism, unspecified: Secondary | ICD-10-CM | POA: Diagnosis present

## 2010-10-29 DIAGNOSIS — O99892 Other specified diseases and conditions complicating childbirth: Principal | ICD-10-CM | POA: Diagnosis present

## 2010-10-29 DIAGNOSIS — Z22330 Carrier of Group B streptococcus: Secondary | ICD-10-CM

## 2010-10-29 HISTORY — DX: Hypothyroidism, unspecified: E03.9

## 2010-10-29 HISTORY — DX: Unspecified ovarian cyst, unspecified side: N83.209

## 2010-10-29 HISTORY — DX: Gonococcal infection, unspecified: A54.9

## 2010-10-29 HISTORY — DX: Chlamydial infection, unspecified: A74.9

## 2010-10-29 HISTORY — DX: Trichomoniasis, unspecified: A59.9

## 2010-10-29 LAB — CBC
HCT: 32.6 % — ABNORMAL LOW (ref 36.0–46.0)
Hemoglobin: 10.7 g/dL — ABNORMAL LOW (ref 12.0–15.0)
MCH: 28.1 pg (ref 26.0–34.0)
MCHC: 32.8 g/dL (ref 30.0–36.0)
MCV: 85.6 fL (ref 78.0–100.0)

## 2010-10-29 MED ORDER — EPHEDRINE 5 MG/ML INJ
10.0000 mg | INTRAVENOUS | Status: DC | PRN
  Filled 2010-10-29: qty 4

## 2010-10-29 MED ORDER — PENICILLIN G POTASSIUM 5000000 UNITS IJ SOLR
2.5000 10*6.[IU] | INTRAVENOUS | Status: DC
  Administered 2010-10-29: 2.5 10*6.[IU] via INTRAVENOUS
  Filled 2010-10-29 (×5): qty 2.5

## 2010-10-29 MED ORDER — ACETAMINOPHEN 325 MG PO TABS: 650.0000 mg | ORAL_TABLET | ORAL | Status: DC | PRN

## 2010-10-29 MED ORDER — DIPHENHYDRAMINE HCL 50 MG/ML IJ SOLN: 12.5000 mg | INTRAMUSCULAR | Status: DC | PRN

## 2010-10-29 MED ORDER — LACTATED RINGERS IV SOLN: 500.0000 mL | Freq: Once | INTRAVENOUS | Status: DC

## 2010-10-29 MED ORDER — FENTANYL 2.5 MCG/ML BUPIVACAINE 1/10 % EPIDURAL INFUSION (WH - ANES)
14.0000 mL/h | INTRAMUSCULAR | Status: DC
  Administered 2010-10-29 – 2010-10-30 (×3): 14 mL/h via EPIDURAL
  Filled 2010-10-29 (×3): qty 60

## 2010-10-29 MED ORDER — PHENYLEPHRINE 40 MCG/ML (10ML) SYRINGE FOR IV PUSH (FOR BLOOD PRESSURE SUPPORT): 80.0000 ug | PREFILLED_SYRINGE | INTRAVENOUS | Status: DC | PRN

## 2010-10-29 MED ORDER — ONDANSETRON HCL 4 MG/2ML IJ SOLN
4.0000 mg | Freq: Four times a day (QID) | INTRAMUSCULAR | Status: DC | PRN
  Administered 2010-10-29: 4 mg via INTRAVENOUS
  Filled 2010-10-29: qty 2

## 2010-10-29 MED ORDER — EPHEDRINE 5 MG/ML INJ: 10.0000 mg | INTRAVENOUS | Status: DC | PRN

## 2010-10-29 MED ORDER — LACTATED RINGERS IV SOLN: 500.0000 mL | INTRAVENOUS | Status: DC | PRN

## 2010-10-29 MED ORDER — LEVOTHYROXINE SODIUM 175 MCG PO TABS
175.0000 ug | ORAL_TABLET | Freq: Every day | ORAL | Status: DC
  Administered 2010-10-29: 175 ug via ORAL
  Filled 2010-10-29 (×2): qty 1

## 2010-10-29 MED ORDER — LIDOCAINE HCL (PF) 1 % IJ SOLN
30.0000 mL | INTRAMUSCULAR | Status: DC | PRN
  Filled 2010-10-29: qty 30

## 2010-10-29 MED ORDER — OXYTOCIN BOLUS FROM INFUSION
500.0000 mL | Freq: Once | INTRAVENOUS | Status: DC
  Administered 2010-10-30: 500 mL via INTRAVENOUS
  Filled 2010-10-29: qty 500

## 2010-10-29 MED ORDER — LACTATED RINGERS IV SOLN
INTRAVENOUS | Status: DC
  Administered 2010-10-29: 300 mL via INTRAVENOUS
  Administered 2010-10-29 (×3): via INTRAVENOUS

## 2010-10-29 MED ORDER — CITRIC ACID-SODIUM CITRATE 334-500 MG/5ML PO SOLN: 30.0000 mL | ORAL | Status: DC | PRN

## 2010-10-29 MED ORDER — IBUPROFEN 600 MG PO TABS: 600.0000 mg | ORAL_TABLET | Freq: Four times a day (QID) | ORAL | Status: DC | PRN

## 2010-10-29 MED ORDER — PHENYLEPHRINE 40 MCG/ML (10ML) SYRINGE FOR IV PUSH (FOR BLOOD PRESSURE SUPPORT)
80.0000 ug | PREFILLED_SYRINGE | INTRAVENOUS | Status: DC | PRN
  Filled 2010-10-29: qty 5

## 2010-10-29 MED ORDER — PENICILLIN G POTASSIUM 5000000 UNITS IJ SOLR
5.0000 10*6.[IU] | Freq: Once | INTRAVENOUS | Status: DC
  Administered 2010-10-29: 5 10*6.[IU] via INTRAVENOUS
  Filled 2010-10-29: qty 5

## 2010-10-29 MED ORDER — OXYCODONE-ACETAMINOPHEN 5-325 MG PO TABS: 2.0000 | ORAL_TABLET | ORAL | Status: DC | PRN

## 2010-10-29 MED ORDER — FLEET ENEMA 7-19 GM/118ML RE ENEM: 1.0000 | ENEMA | RECTAL | Status: DC | PRN

## 2010-10-29 MED ORDER — OXYTOCIN 20 UNITS IN LACTATED RINGERS INFUSION - SIMPLE
125.0000 mL/h | INTRAVENOUS | Status: AC
  Filled 2010-10-29: qty 1000

## 2010-10-29 NOTE — H&P (Signed)
Marissa Doyle is a 30 y.o. female [phone] with IUP at [redacted]w[redacted]d presenting for active labor. Pt states she has been having regular, every 5-10 minutes, associated with scant staining vaginal bleeding, intact, with active.  She desires to implanon.  [facility]  since 7.4wks  Prenatal History/Complications: Hypothyroidism Obesity +tobacco Rubella non-immune GBS+ on urine culture  Past Medical History: Past Medical History  Diagnosis Date  . 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 Ovarian cyst   . Ectopic pregnancy     Received MTX  . Gonorrhea   . Chlamydia   . Trichomonas     Past Surgical History: History reviewed. No pertinent past surgical history.  Obstetrical History: OB History    Grav Para Term Preterm Abortions TAB SAB Ect Mult Living   5 3 3  0 1 0 0 1 0 3       Social History: History   Social History  . Marital Status: Single    Spouse Name: N/A    Number of Children: N/A  . Years of Education: N/A   Social History Main Topics  . Smoking status: Current Everyday Smoker -- 0.2 packs/day  . Smokeless tobacco: Never Used  . Alcohol Use: No  . Drug Use: No  . Sexually Active: Yes    Birth Control/ Protection: None   Other Topics Concern  . None   Social History Narrative  . None    Family History: No family history on file.  Allergies: No Known Allergies  Prescriptions prior to admission  Medication Sig Dispense Refill  . levothyroxine (SYNTHROID, LEVOTHROID) 175 MCG tablet Take 175 mcg by mouth daily.        . prenatal vitamin w/FE, FA (PRENATAL 1 + 1) 27-1 MG TABS Take 1 tablet by mouth daily.          Review of Systems - Negative except listed in HPI   Blood pressure 134/72, pulse 98, temperature 97.7 F (36.5 C), temperature source Oral, resp. rate 20, height 5\' 3"  (1.6 m), weight 214 lb (97.07 kg). General appearance: alert, cooperative and mild distress Head: Normocephalic, without obvious abnormality, atraumatic Lungs: clear  to auscultation bilaterally Heart: regular rate and rhythm, S1, S2 normal, no murmur, click, rub or gallop Abdomen: gravid, soft, nontender Extremities: extremities normal, atraumatic, no cyanosis or edema cephalic Baseline: 130 bpm, mod variability, +accels, no decels Frequency: Every 3-5 minutes Dilation: 4 Effacement (%): 100 Station: -3   Prenatal labs: ABO, Rh: O/Positive/-- (03/15 0000) Antibody: Negative (03/15 0000) Rubella:  non-immune RPR: Nonreactive (03/15 0000)  HBsAg: Negative (03/15 0000)  HIV: Non-reactive (03/15 0000)  GBS: NEGATIVE (08/02 0839)  1 hr Glucola 119 Genetic screening - elevated risk fo downs on quad, declined amnio Anatomy US - wnl   Assessment: Marissa Doyle is a 30 y.o. [phone] with an IUP at [redacted]w[redacted]d presenting for active labor  Plan: -Will admit to L+D -start PCN for GBS+ -plans on breast/bottle feeding + implanon for contraception   I have discussed this case with Philipp Deputy, CNM who is in agreement with this plan Lindaann Slough MD 10/29/2010, 5:33 PM

## 2010-10-29 NOTE — Progress Notes (Signed)
Ctx's q5-10, spotting.  No leaking.  High risk clinic due to thyroid.  G4 P3  edc 08/26

## 2010-10-29 NOTE — Anesthesia Preprocedure Evaluation (Addendum)
Anesthesia Evaluation  Name, MR# and DOB Patient awake  General Assessment Comment  Reviewed: Allergy & Precautions, H&P , Patient's Chart, lab work & pertinent test results  Airway Mallampati: IV TM Distance: >3 FB Neck ROM: full    Dental  (+) Teeth Intact   Pulmonary  clear to auscultation  breath sounds clear to auscultation none    Cardiovascular regular Normal    Neuro/Psych   GI/Hepatic/Renal   Endo/Other    Abdominal   Musculoskeletal   Hematology   Peds  Reproductive/Obstetrics (+) Pregnancy    Anesthesia Other Findings                Anesthesia Physical Anesthesia Plan  ASA: II  Anesthesia Plan: Epidural   Post-op Pain Management:    Induction:   Airway Management Planned:   Additional Equipment:   Intra-op Plan:   Post-operative Plan:   Informed Consent: I have reviewed the patients History and Physical, chart, labs and discussed the procedure including the risks, benefits and alternatives for the proposed anesthesia with the patient or authorized representative who has indicated his/her understanding and acceptance.   Dental Advisory Given  Plan Discussed with: CRNA and Surgeon  Anesthesia Plan Comments: (Labs checked- platelets confirmed with RN in room. Fetal heart tracing, per RN, reportedly stable enough for sitting procedure. Discussed epidural, and patient consents to the procedure:  included risk of possible headache,backache, failed block, allergic reaction, and nerve injury. This patient was asked if she had any questions or concerns before the procedure started. )        Anesthesia Quick Evaluation

## 2010-10-29 NOTE — Progress Notes (Signed)
Marissa Doyle is a 30 y.o. [phone] at [redacted]w[redacted]d by LMP admitted for active labor  Subjective: Pt comfortable with epidural.  Vomited earlier but denies n/v at this time. Family members at bedside for support.  Objective: BP 124/66   Pulse 92   Temp(Src) 97.2 F (36.2 C) (Oral)   Resp 18   Ht 5\' 3"  (1.6 m)   Wt 97.07 kg (214 lb)   BMI 37.91 kg/m2      FHT:  FHR: 145 bpm, variability: moderate,  accelerations:  Abscent,  decelerations:  Present Prolonged deceleration x1 lasting 3 minutes while pt vomitting.  No accels but moderate variability since deceleration. UC:   regular, every 4 minutes, lasting 80-90 sec. SVE:   Dilation: 5 Effacement (%): 90 Station: -2 Exam by:: L. Scotty Court, rN  Labs: Lab Results  Component Value Date   WBC 9.8 10/29/2010   HGB 10.7* 10/29/2010   HCT 32.6* 10/29/2010   MCV 85.6 10/29/2010   PLT 182 10/29/2010    Assessment / Plan: Spontaneous labor, progressing normally Discussed AROM with pt after adequate GBS prophylaxis Recheck/AROM in >2 hours  Labor: Progressing normally Preeclampsia:  N/A Fetal Wellbeing:  Category II Pain Control:  Epidural I/D:  n/a Anticipated MOD:  NSVD  Sharen Counter 10/29/2010, 9:08 PM

## 2010-10-29 NOTE — Progress Notes (Signed)
Marissa Doyle is a 30 y.o. [phone] at [redacted]w[redacted]d by LMP admitted for active labor  Subjective: Pt comfortable with epidural.  Family members at bedside for support.  Objective: BP 128/50   Pulse 97   Temp(Src) 97.2 F (36.2 C) (Oral)   Resp 18   Ht 5\' 3"  (1.6 m)   Wt 97.07 kg (214 lb)   BMI 37.91 kg/m2      FHT:  FHR: 135 bpm, variability: moderate,  accelerations:  Present,  decelerations:  Present episodic variables with some contractions UC:   regular, every 2-3 minutes SVE:  6/90/-2 by Courtney Paris, SNM AROM with clear fluid  Labs: Lab Results  Component Value Date   WBC 9.8 10/29/2010   HGB 10.7* 10/29/2010   HCT 32.6* 10/29/2010   MCV 85.6 10/29/2010   PLT 182 10/29/2010    Assessment / Plan: Spontaneous labor, progressing normally  Labor: Progressing normally Preeclampsia:  N/A Fetal Wellbeing:  Category II r/t variables, overall category I Pain Control:  Epidural I/D:  n/a Anticipated MOD:  NSVD  Sharen Counter 10/29/2010, 11:19 PM

## 2010-10-29 NOTE — Anesthesia Procedure Notes (Addendum)
Epidural Patient location during procedure: OB Start time: 10/29/2010 6:26 PM  Staffing Anesthesiologist: Jiles Garter  Preanesthetic Checklist Completed: patient identified, site marked, surgical consent, pre-op evaluation, timeout performed, IV checked, risks and benefits discussed and monitors and equipment checked  Epidural Patient position: sitting Prep: site prepped and draped and DuraPrep Patient monitoring: continuous pulse ox and blood pressure Approach: midline Injection technique: LOR air  Needle:  Needle type: Tuohy  Needle gauge: 17 G Needle length: 9 cm Needle insertion depth: 6 cm Catheter type: closed end flexible Catheter size: 19 Gauge Catheter at skin depth: 12 cm Test dose: negative  Assessment Events: blood not aspirated, injection not painful, no injection resistance, negative IV test and no paresthesia  Additional Notes Dosing of Epidural: 1st dose, Through needle...... 5mg  Marcaine 2nd dose, through catheter.... epi 1:200K + Xylocaine 40 mg 3rd dose, through catheter...Marland KitchenMarland Kitchenepi 1:200K + Xylocaine 60 mg Each dose occurred after waiting 3 min,patient was free of IV sx; and patient exhibits no evidence of SA injection  Patient is more comfortable after epidural dosed. Please see RN's note for documentation of vital signs,and FHR which are stable.

## 2010-10-30 ENCOUNTER — Encounter (HOSPITAL_COMMUNITY): Payer: Self-pay | Admitting: *Deleted

## 2010-10-30 DIAGNOSIS — O9989 Other specified diseases and conditions complicating pregnancy, childbirth and the puerperium: Secondary | ICD-10-CM

## 2010-10-30 DIAGNOSIS — E079 Disorder of thyroid, unspecified: Secondary | ICD-10-CM

## 2010-10-30 DIAGNOSIS — E039 Hypothyroidism, unspecified: Secondary | ICD-10-CM

## 2010-10-30 DIAGNOSIS — O99284 Endocrine, nutritional and metabolic diseases complicating childbirth: Secondary | ICD-10-CM

## 2010-10-30 MED ORDER — IBUPROFEN 600 MG PO TABS
600.0000 mg | ORAL_TABLET | Freq: Four times a day (QID) | ORAL | Status: DC
  Administered 2010-10-30 – 2010-10-31 (×4): 600 mg via ORAL
  Filled 2010-10-30 (×4): qty 1

## 2010-10-30 MED ORDER — OXYCODONE-ACETAMINOPHEN 5-325 MG PO TABS
1.0000 | ORAL_TABLET | ORAL | Status: DC | PRN
  Administered 2010-10-30 – 2010-10-31 (×4): 1 via ORAL
  Filled 2010-10-30: qty 1
  Filled 2010-10-30: qty 2
  Filled 2010-10-30 (×2): qty 1

## 2010-10-30 MED ORDER — MEASLES, MUMPS & RUBELLA VAC ~~LOC~~ INJ
0.5000 mL | INJECTION | Freq: Once | SUBCUTANEOUS | Status: DC
  Filled 2010-10-30: qty 0.5

## 2010-10-30 MED ORDER — LEVOTHYROXINE SODIUM 175 MCG PO TABS
175.0000 ug | ORAL_TABLET | Freq: Every day | ORAL | Status: DC
  Administered 2010-10-30 – 2010-10-31 (×2): 175 ug via ORAL
  Filled 2010-10-30 (×2): qty 1

## 2010-10-30 NOTE — Anesthesia Postprocedure Evaluation (Signed)
  Anesthesia Post-op Note  Patient: Marissa Doyle  Procedure(s) Performed: * No procedures listed * This patient has recovered from her labor epidural, and I am not aware of any complications or problems.

## 2010-10-30 NOTE — Progress Notes (Signed)
Pt. Refuse pain medication at this time.

## 2010-10-30 NOTE — Anesthesia Postprocedure Evaluation (Signed)
  Anesthesia Post-op Note  Patient: Marissa Doyle  Procedure(s) Performed: * No procedures listed *  Patient Location: PACU and Mother/Baby  Anesthesia Type: Epidural  Level of Consciousness: awake, alert  and oriented  Airway and Oxygen Therapy: Patient Spontanous Breathing  Post-op Pain: none  Post-op Assessment: Patient's Cardiovascular Status Stable, Respiratory Function Stable, Patent Airway, No signs of Nausea or vomiting and Adequate PO intake  Post-op Vital Signs: stable  Complications: No apparent anesthesia complications

## 2010-10-31 MED ORDER — IBUPROFEN 600 MG PO TABS: 600.0000 mg | ORAL_TABLET | Freq: Four times a day (QID) | ORAL | Status: AC

## 2010-10-31 MED ORDER — MEASLES, MUMPS & RUBELLA VAC ~~LOC~~ INJ
0.5000 mL | INJECTION | Freq: Once | SUBCUTANEOUS | Status: AC
  Administered 2010-10-31: 0.5 mL via SUBCUTANEOUS
  Filled 2010-10-31: qty 0.5

## 2010-10-31 MED ORDER — OXYCODONE-ACETAMINOPHEN 5-325 MG PO TABS: 1.0000 | ORAL_TABLET | ORAL | Status: AC | PRN

## 2010-10-31 MED ORDER — DOCUSATE SODIUM 100 MG PO CAPS: 100.0000 mg | ORAL_CAPSULE | Freq: Two times a day (BID) | ORAL | Status: AC

## 2010-10-31 NOTE — Discharge Summary (Signed)
Agree with d/c summary by PGY2.  D/c home in stable condition. Everly Rubalcava 6:36 AM 10/31/2010

## 2010-10-31 NOTE — Progress Notes (Signed)
UR chart review completed.  

## 2010-10-31 NOTE — Discharge Summary (Signed)
Obstetric Discharge Summary Reason for Admission: onset of labor Prenatal Procedures: ultrasound Intrapartum Procedures: spontaneous vaginal delivery Postpartum Procedures: none Complications-Operative and Postpartum: none Hemoglobin  Date Value Range Status  10/29/2010 10.7* 12.0-15.0 (g/dL) Final     HCT  Date Value Range Status  10/29/2010 32.6* 36.0-46.0 (%) Final    Discharge Diagnoses: Term Pregnancy-delivered  Discharge Information: Date: 10/31/2010 Activity: pelvic rest Diet: routine Medications: Ibuprophen, Colace and Percocet Condition: stable Instructions: refer to practice specific booklet Discharge to: home   Newborn Data: Live born female  Birth Weight: 6 lb 15 oz (3147 g) APGAR: 8, 9  Home with mother.  Lindaann Slough MD 10/31/2010, 6:12 AM

## 2010-10-31 NOTE — Progress Notes (Signed)
Post Partum Day 1 Subjective: no complaints, up ad lib, voiding and + flatus  Objective: Blood pressure 128/82, pulse 68, temperature 97.6 F (36.4 C), temperature source Oral, resp. rate 18, height 5\' 3"  (1.6 m), weight 214 lb (97.07 kg), SpO2 100.00%, unknown if currently breastfeeding.  Physical Exam:  General: alert and cooperative Lochia: appropriate Uterine Fundus: firm DVT Evaluation: No evidence of DVT seen on physical exam. Negative Homan's sign.   Basename 10/29/10 1744  HGB 10.7*  HCT 32.6*    Assessment/Plan: Discharge home and Contraception implanon, breast/bottle feeding   LOS: 2 days   Lindaann Slough. 10/31/2010, 6:08 AM

## 2010-11-08 ENCOUNTER — Inpatient Hospital Stay (HOSPITAL_COMMUNITY)
Admission: AD | Admit: 2010-11-08 | Discharge: 2010-11-12 | DRG: 776 | Disposition: A | Discharge status: Home / Self Care | Payer: Medicaid Other | Source: Ambulatory Visit | Attending: Obstetrics and Gynecology | Admitting: Obstetrics and Gynecology

## 2010-11-08 DIAGNOSIS — IMO0002 Reserved for concepts with insufficient information to code with codable children: Secondary | ICD-10-CM | POA: Diagnosis present

## 2010-11-08 DIAGNOSIS — O165 Unspecified maternal hypertension, complicating the puerperium: Secondary | ICD-10-CM | POA: Diagnosis present

## 2010-11-08 DIAGNOSIS — O871 Deep phlebothrombosis in the puerperium: Secondary | ICD-10-CM | POA: Diagnosis present

## 2010-11-08 DIAGNOSIS — I82409 Acute embolism and thrombosis of unspecified deep veins of unspecified lower extremity: Secondary | ICD-10-CM | POA: Diagnosis present

## 2010-11-08 DIAGNOSIS — I160 Hypertensive urgency: Secondary | ICD-10-CM

## 2010-11-08 DIAGNOSIS — O135 Gestational [pregnancy-induced] hypertension without significant proteinuria, complicating the puerperium: Principal | ICD-10-CM | POA: Diagnosis present

## 2010-11-08 DIAGNOSIS — R51 Headache: Secondary | ICD-10-CM

## 2010-11-08 DIAGNOSIS — E039 Hypothyroidism, unspecified: Secondary | ICD-10-CM | POA: Diagnosis present

## 2010-11-08 DIAGNOSIS — E079 Disorder of thyroid, unspecified: Secondary | ICD-10-CM | POA: Diagnosis present

## 2010-11-08 LAB — URINALYSIS, ROUTINE W REFLEX MICROSCOPIC
Bilirubin Urine: NEGATIVE
Glucose, UA: NEGATIVE mg/dL
Ketones, ur: NEGATIVE mg/dL
Leukocytes, UA: NEGATIVE
Protein, ur: NEGATIVE mg/dL

## 2010-11-08 LAB — CBC
HCT: 33.6 % — ABNORMAL LOW (ref 36.0–46.0)
Hemoglobin: 10.9 g/dL — ABNORMAL LOW (ref 12.0–15.0)
MCH: 27.7 pg (ref 26.0–34.0)
MCHC: 32.4 g/dL (ref 30.0–36.0)
RDW: 13.9 % (ref 11.5–15.5)

## 2010-11-08 LAB — COMPREHENSIVE METABOLIC PANEL
Albumin: 3.1 g/dL — ABNORMAL LOW (ref 3.5–5.2)
Alkaline Phosphatase: 98 U/L (ref 39–117)
BUN: 14 mg/dL (ref 6–23)
Calcium: 9.1 mg/dL (ref 8.4–10.5)
Creatinine, Ser: 0.97 mg/dL (ref 0.50–1.10)
GFR calc Af Amer: >60 mL/min (ref >60)
Glucose, Bld: 78 mg/dL (ref 70–99)
Potassium: 3.7 mEq/L (ref 3.5–5.1)
Total Protein: 7.1 g/dL (ref 6.0–8.3)

## 2010-11-08 LAB — DIFFERENTIAL
Basophils Relative: 0 % (ref 0–1)
Monocytes Absolute: 0.5 10*3/uL (ref 0.1–1.0)
Monocytes Relative: 9 % (ref 3–12)
Neutro Abs: 3.1 10*3/uL (ref 1.7–7.7)

## 2010-11-08 LAB — URINE MICROSCOPIC-ADD ON

## 2010-11-08 LAB — PROTEIN / CREATININE RATIO, URINE
Creatinine, Urine: 80.05 mg/dL
Protein Creatinine Ratio: 0.07 (ref 0.00–0.15)
Total Protein, Urine: 5.7 mg/dL

## 2010-11-08 MED ORDER — LEVOTHYROXINE SODIUM 175 MCG PO TABS
175.0000 ug | ORAL_TABLET | Freq: Every day | ORAL | Status: DC
  Administered 2010-11-09 – 2010-11-12 (×4): 175 ug via ORAL
  Filled 2010-11-08 (×5): qty 1

## 2010-11-08 MED ORDER — PRENATAL PLUS 27-1 MG PO TABS
1.0000 | ORAL_TABLET | Freq: Every day | ORAL | Status: DC
  Administered 2010-11-09 – 2010-11-12 (×4): 1 via ORAL
  Filled 2010-11-08 (×4): qty 1

## 2010-11-08 MED ORDER — LACTATED RINGERS IV SOLN
INTRAVENOUS | Status: DC
  Administered 2010-11-08: 23:00:00 via INTRAVENOUS

## 2010-11-08 MED ORDER — ACETAMINOPHEN 650 MG RE SUPP: 650.0000 mg | Freq: Four times a day (QID) | RECTAL | Status: DC | PRN

## 2010-11-08 MED ORDER — LABETALOL HCL 5 MG/ML IV SOLN: 10.0000 mg | Freq: Once | INTRAVENOUS | Status: DC

## 2010-11-08 MED ORDER — OXYCODONE-ACETAMINOPHEN 5-325 MG PO TABS: 1.0000 | ORAL_TABLET | ORAL | Status: DC | PRN

## 2010-11-08 MED ORDER — IBUPROFEN 600 MG PO TABS
600.0000 mg | ORAL_TABLET | Freq: Four times a day (QID) | ORAL | Status: DC
  Administered 2010-11-08 – 2010-11-12 (×14): 600 mg via ORAL
  Filled 2010-11-08 (×14): qty 1

## 2010-11-08 MED ORDER — DOCUSATE SODIUM 100 MG PO CAPS
100.0000 mg | ORAL_CAPSULE | Freq: Two times a day (BID) | ORAL | Status: DC
  Administered 2010-11-08 – 2010-11-12 (×8): 100 mg via ORAL
  Filled 2010-11-08 (×8): qty 1

## 2010-11-08 MED ORDER — MAGNESIUM SULFATE 40 G IN LACTATED RINGERS - SIMPLE
2.0000 g/h | INTRAVENOUS | Status: DC
  Administered 2010-11-08: 2 g/h via INTRAVENOUS
  Filled 2010-11-08: qty 500

## 2010-11-08 MED ORDER — LABETALOL HCL 200 MG PO TABS
200.0000 mg | ORAL_TABLET | Freq: Two times a day (BID) | ORAL | Status: DC
  Administered 2010-11-08 – 2010-11-09 (×2): 200 mg via ORAL
  Filled 2010-11-08 (×4): qty 1

## 2010-11-08 MED ORDER — ACETAMINOPHEN 325 MG PO TABS
650.0000 mg | ORAL_TABLET | Freq: Four times a day (QID) | ORAL | Status: DC | PRN
  Administered 2010-11-09: 650 mg via ORAL
  Filled 2010-11-08: qty 2

## 2010-11-08 MED ORDER — MAGNESIUM SULFATE BOLUS VIA INFUSION
4.0000 g | Freq: Once | INTRAVENOUS | Status: AC
  Administered 2010-11-08: 4 g via INTRAVENOUS
  Filled 2010-11-08: qty 500

## 2010-11-08 NOTE — H&P (Signed)
Marissa Doyle is an 30 y.o. female.   Chief Complaint: Hypertension HPI: Was seen by home health nurse today. Was noted to have elevated BP and instructed to come to ED. Upon arrival found to have BP of 170's/90's. No headaches. No tremors. No vision change, no RUQ pain. Does have some mild swelling of her left leg, which she reports has been improving over the past two days. Patient is 8d postpartum from uncomplicated vaginal delivery. She has been doing well at home. Reports that two days ago she had some chills which she attributed to the pain medicine. She also reports poor sleep, which she attributes to having a newborn at home. Denies tremors. Denies heat or cold intolerance. She is not breastfeeding. Denies any breast tenderness or drainage. Pregnancy was complicated by hypothyroidism. Treated with synthroid. Has been maintained on the same dose as during her pregnancy. Pregnancy also complicated by depression/anxiety. Was treated with wellbutrin. Reports stable mood since delivery. No sexual activity since delivery. Denies any sick contacts.   Past Medical History  Diagnosis Date  . 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 Ovarian cyst   . Ectopic pregnancy     Received MTX  . Gonorrhea   . Chlamydia   . Trichomonas     No past surgical history on file.  No family history on file. Social History:  reports that she has been smoking.  She has never used smokeless tobacco. She reports that she does not drink alcohol or use illicit drugs.  Allergies: No Known Allergies  Medications Prior to Admission  Medication Dose Route Frequency [name] Last Rate Last Dose  . acetaminophen (TYLENOL) tablet 650 mg  650 mg Oral Q6H PRN [name], MD       Or  . acetaminophen (TYLENOL) suppository 650 mg  650 mg Rectal Q6H PRN [name], MD      . labetalol (NORMODYNE,TRANDATE) injection 10 mg  10 mg Intravenous Once [name], MD       Medications Prior to Admission  Medication  Sig Dispense Refill  . ibuprofen (ADVIL,MOTRIN) 600 MG tablet Take 1 tablet (600 mg total) by mouth every 6 (six) hours.  60 tablet  0  . levothyroxine (SYNTHROID, LEVOTHROID) 175 MCG tablet Take 175 mcg by mouth daily.        . prenatal vitamin w/FE, FA (PRENATAL 1 + 1) 27-1 MG TABS Take 1 tablet by mouth daily.        Marland Kitchen docusate sodium (COLACE) 100 MG capsule Take 1 capsule (100 mg total) by mouth 2 (two) times daily.  60 capsule  0  . oxyCODONE-acetaminophen (PERCOCET) 5-325 MG per tablet Take 1-2 tablets by mouth every 3 (three) hours as needed (moderate - severe pain).  12 tablet  0    Results for orders placed during the hospital encounter of 11/08/10 (from the past 48 hour(s))  CBC     Status: Abnormal   Collection Time   11/08/10  6:03 PM      Component Value Range Comment   WBC 5.7  4.0 - 10.5 (K/uL)    RBC 3.93  3.87 - 5.11 (MIL/uL)    Hemoglobin 10.9 (*) 12.0 - 15.0 (g/dL)    HCT 16.1 (*) 09.6 - 46.0 (%)    MCV 85.5  78.0 - 100.0 (fL)    MCH 27.7  26.0 - 34.0 (pg)    MCHC 32.4  30.0 - 36.0 (g/dL)  RDW 13.9  11.5 - 15.5 (%)    Platelets 241  150 - 400 (K/uL)    No results found.  Review of Systems  Constitutional: Positive for chills. Negative for fever, weight loss, malaise/fatigue and diaphoresis.  Eyes: Negative for blurred vision, double vision, photophobia and pain.  Respiratory: Negative for cough, hemoptysis, sputum production, shortness of breath and wheezing.   Cardiovascular: Positive for leg swelling. Negative for chest pain, palpitations and orthopnea.  Gastrointestinal: Positive for abdominal pain. Negative for heartburn, nausea, vomiting, diarrhea, constipation, blood in stool and melena.       Mild TTP; appropriate for postpartum  Genitourinary: Negative for dysuria, urgency, frequency, hematuria and flank pain.       Mild vaginal bleeding; resolving appropriately  Musculoskeletal: Positive for myalgias. Negative for joint pain.       L upper inner thigh  pain; feels musculoskeletal; likely 2/2 labor  Skin: Negative for itching and rash.  Neurological: Positive for headaches. Negative for dizziness, tingling, focal weakness, seizures, loss of consciousness and weakness.  Endo/Heme/Allergies: Negative for polydipsia. Does not bruise/bleed easily.  Psychiatric/Behavioral: Negative for depression. The patient is not nervous/anxious.     Blood pressure 171/93, pulse 55, temperature 100.1 F (37.8 C), temperature source Oral, resp. rate 16, height 5\' 3"  (1.6 m), weight 201 lb 4 oz (91.286 kg), not currently breastfeeding. Physical Exam  Constitutional: She is oriented to person, place, and time. She appears well-developed and well-nourished. No distress.  HENT:  Head: Normocephalic and atraumatic.  Mouth/Throat: No oropharyngeal exudate.  Eyes: Conjunctivae and EOM are normal. Pupils are equal, round, and reactive to light.  Neck: Normal range of motion. No JVD present. No thyromegaly present.  Cardiovascular: Normal rate and regular rhythm.   Murmur heard.      2/6 systolic murmur. Loudest over pulmonic area. Non-radiating.  Respiratory: Effort normal and breath sounds normal. No stridor. No respiratory distress. She has no wheezes. She has no rales. She exhibits no tenderness.  GI: Soft. She exhibits no distension. There is no rebound and no guarding.       Mild TTP, appropriate for postpartum; fundus firm well below umbilicus  Musculoskeletal: Normal range of motion. She exhibits edema.       Tr LLE edema  Lymphadenopathy:    She has no cervical adenopathy.  Neurological: She is alert and oriented to person, place, and time. She has normal reflexes. No cranial nerve deficit. Coordination normal.  Skin: Skin is warm and dry. No rash noted. She is not diaphoretic. No erythema. No pallor.  Psychiatric: She has a normal mood and affect. Her behavior is normal. Judgment and thought content normal.     Assessment/Plan 30yo 8d postpartum with  asymmetric edema and hypertensive urgency 1)Will treat BP with IV labetalol. Concern for pre-E. Getting UA, CMP, CBC. Will admit for IV MgSO4 x24h 2)Edema - will check [place] of lower extremity 3)Hypothyroidism - will check TSH 4)DVT ppx with SCDs pending [place] results 5)FULL CODE  Ward, Raina Mina 11/08/2010, 6:38 PM

## 2010-11-08 NOTE — Progress Notes (Signed)
Pt. Received from MAU via wheelchair and admitted to bed 373. ICU surroundings, proceedures and plan of care discussed with pt.

## 2010-11-08 NOTE — Progress Notes (Signed)
Post Partum Day 8 Subjective: notes a "little bit" headache, not perceived by pt as unusual, no scotoma or vision changes. Denies calf discomfort. denies ruq discomfort.  Objective: Blood pressure 168/77, pulse 50, temperature 100.1 F (37.8 C), temperature source Oral, resp. rate 16, height 5\' 3"  (1.6 m), weight 91.286 kg (201 lb 4 oz), not currently breastfeeding.  Physical Exam:  General: alert, cooperative, appears stated age and no distress Lochia: appropriate Uterine Fundus: firm Incision: n/a DVT Evaluation: No evidence of DVT seen on physical exam. Negative Homan's sign. No cords or calf tenderness. No significant calf/ankle edema. Reflexes 2+ without clonus  Basename 11/08/10 1803  HGB 10.9*  HCT 33.6*    Assessment/Plan: Postpartum hypertenison, severe, rule out pre-e     Initiated on MgSulfate, will continue x 24 hours.    No acute DVT concerns     Will cancel doppler and use flowtrons  Will check Protein/creatinine ratio   LOS: 0 days   FERGUSON,JOHN V 11/08/2010, 10:21 PM

## 2010-11-08 NOTE — Progress Notes (Signed)
Pt states vaginal delivery 10/30/2010, HH RN at home today, bp was 150 systolic, told to come to MAU for eval, denies pain, no dizziness or blurred vision. No RUQ pain.

## 2010-11-09 DIAGNOSIS — R51 Headache: Secondary | ICD-10-CM

## 2010-11-09 DIAGNOSIS — I10 Essential (primary) hypertension: Secondary | ICD-10-CM

## 2010-11-09 MED ORDER — LABETALOL HCL 100 MG PO TABS
100.0000 mg | ORAL_TABLET | Freq: Once | ORAL | Status: AC
  Administered 2010-11-09: 100 mg via ORAL
  Filled 2010-11-09: qty 1

## 2010-11-09 MED ORDER — SODIUM CHLORIDE 0.9 % IJ SOLN
3.0000 mL | Freq: Two times a day (BID) | INTRAMUSCULAR | Status: DC
  Administered 2010-11-09: 3 mL via INTRAVENOUS

## 2010-11-09 MED ORDER — CHLORHEXIDINE GLUCONATE CLOTH 2 % EX PADS
6.0000 | MEDICATED_PAD | Freq: Every day | CUTANEOUS | Status: DC
  Administered 2010-11-09 – 2010-11-10 (×2): 6 via TOPICAL
  Filled 2010-11-09 (×4): qty 6

## 2010-11-09 MED ORDER — MUPIROCIN 2 % EX OINT
1.0000 "application " | TOPICAL_OINTMENT | Freq: Two times a day (BID) | CUTANEOUS | Status: DC
  Administered 2010-11-09 – 2010-11-12 (×6): 1 via NASAL
  Filled 2010-11-09: qty 22

## 2010-11-09 MED ORDER — LABETALOL HCL 300 MG PO TABS
300.0000 mg | ORAL_TABLET | Freq: Two times a day (BID) | ORAL | Status: DC
  Administered 2010-11-09: 300 mg via ORAL
  Filled 2010-11-09 (×2): qty 1

## 2010-11-09 NOTE — Progress Notes (Signed)
Post Partum Day 9, hosp day 2 admitted for Postpartum HTN to 170 systolic Subjective:2 no complaints, up ad lib, voiding and tolerating PO  Denies headache , vision changes or ruq pain, denies leg pain.  Her admission discomfort in the left inguinal area has completely resolved with ambulation  Objective: Blood pressure 136/82, pulse 63, temperature 98.4 F (36.9 C), temperature source Oral, resp. rate 18, height 5\' 3"  (1.6 m), weight 91.286 kg (201 lb 4 oz), SpO2 100.00%, not currently breastfeeding.  Physical Exam:  General: alert and cooperative Lochia: appropriate Uterine Fundus: nontender unable to feel top of fundus Incision: n/a DVT Evaluation: No evidence of DVT seen on physical exam. Negative Homan's sign. Reflexes 1+ no clonus  Pr/Cr ratio on admit found, 0.07, within normal limits   Basename 11/08/10 1803  HGB 10.9*  HCT 33.6*    Assessment/Plan: Contraception  Consider discontinuing mg Sulfate this a.m, and deciding on postpartum htn meds once she's off Mg Sulfate   LOS: 1 day   FERGUSON,JOHN V 11/09/2010, 7:45 AM

## 2010-11-09 NOTE — Progress Notes (Signed)
1630 Received bed assignment for transfer to [facility] - assigned RN to call for report.

## 2010-11-09 NOTE — Plan of Care (Signed)
Problem: Phase I Progression Outcomes Goal: Pain controlled with appropriate interventions Outcome: Completed/Met Date Met:  11/09/10 Pt. Denies any pain or discomforts at this time

## 2010-11-09 NOTE — Progress Notes (Signed)
Patient still has SBP >/= 150 and Doyle slight headache.  Will increase her Labetalol to 300mg  po bid, give her additional 100mg  now.  Continue to monitor BP.  No need for AICU care, will be transferred to the floor.  Routine postpartum care. Possible discharge tomorrow morning if patient is stable and BP is controlled.  [name],Marissa Doyle 11/09/2010 2:18 PM

## 2010-11-09 NOTE — Progress Notes (Signed)
Patient's magnesium sulfate will be discontinued, and blood pressures assessed off magnesium sulfate.  Will likely send pt home later today on current dose labetolol and recheck pressures in outpatient in a week.

## 2010-11-09 NOTE — Progress Notes (Signed)
1430 Notified [facility] of transfer orders - awaiting bed assignment.

## 2010-11-10 DIAGNOSIS — O165 Unspecified maternal hypertension, complicating the puerperium: Secondary | ICD-10-CM

## 2010-11-10 MED ORDER — LABETALOL HCL 200 MG PO TABS
400.0000 mg | ORAL_TABLET | Freq: Two times a day (BID) | ORAL | Status: DC
  Filled 2010-11-10: qty 2

## 2010-11-10 MED ORDER — LABETALOL HCL 200 MG PO TABS
400.0000 mg | ORAL_TABLET | Freq: Two times a day (BID) | ORAL | Status: DC
Start: 2010-11-10 — End: 2010-11-11
  Administered 2010-11-10 – 2010-11-11 (×3): 400 mg via ORAL
  Filled 2010-11-10: qty 2

## 2010-11-10 MED ORDER — AMLODIPINE BESYLATE 10 MG PO TABS
10.0000 mg | ORAL_TABLET | Freq: Every day | ORAL | Status: DC
  Administered 2010-11-10 – 2010-11-12 (×3): 10 mg via ORAL
  Filled 2010-11-10 (×4): qty 1

## 2010-11-10 NOTE — Progress Notes (Signed)
Post Partum Day 10, admitted 11/06/10 for severe range postpartum HTN.  Now s/p Magnesium sulfate x 24 hours, being treated with Labetolol.  Subjective: no complaints, up ad lib, voiding and tolerating PO  Denies headache , vision changes or ruq pain, denies leg pain. Baby is doing well.  Objective: Current BP 165/91 Blood pressure 139/88, pulse 69, temperature 98.8 F (37.1 C), temperature source Oral, resp. rate 18, height 5\' 3"  (1.6 m), weight 91.286 kg (201 lb 4 oz), SpO2 99.00%, not currently breastfeeding.  Physical Exam:  General: alert and cooperative Lochia: appropriate Uterine Fundus: nontender unable to feel top of fundus DVT Evaluation: No evidence of DVT seen on physical exam. Negative Homan's sign. Reflexes 2+ no clonus  Labs: Pr/Cr ratio on admit found, 0.07, within normal limits  Basename 11/08/10 1803  HGB 10.9*  HCT 33.6*    Assessment/Plan: Will add Norvasc 10 mg po daily; increase Labetalol to 400 mg bid. Continue to monitor BP and titrate regimen as needed. May discharge to home later today if BP remains stable.    LOS: 2 days   [name],UGONNA A 11/10/2010, 6:24 AM

## 2010-11-11 MED ORDER — HYDROCHLOROTHIAZIDE 25 MG PO TABS
25.0000 mg | ORAL_TABLET | Freq: Every day | ORAL | Status: DC
  Administered 2010-11-12: 25 mg via ORAL
  Filled 2010-11-11 (×2): qty 1

## 2010-11-11 MED ORDER — HYDROCHLOROTHIAZIDE 12.5 MG PO CAPS
12.5000 mg | ORAL_CAPSULE | Freq: Every day | ORAL | Status: DC
  Administered 2010-11-11: 12.5 mg via ORAL
  Filled 2010-11-11 (×2): qty 1

## 2010-11-11 MED ORDER — HYDROCHLOROTHIAZIDE 12.5 MG PO CAPS
12.5000 mg | ORAL_CAPSULE | Freq: Once | ORAL | Status: AC
  Administered 2010-11-11: 12.5 mg via ORAL
  Filled 2010-11-11: qty 1

## 2010-11-11 NOTE — Progress Notes (Addendum)
Post Partum Day 11, admitted 11/06/10 for severe range postpartum HTN.  Now s/p Magnesium sulfate x 24 hours, being treated with Labetolol and Norvasc.   Subjective: no complaints, up ad lib, voiding and tolerating PO  Denies headache , vision changes or ruq pain, denies leg pain. Baby is doing well. Asked about goal BP and what triggered her HTN.   Objective: Current BP 169/98 Blood pressure 169/98, pulse 56, temperature 98.1 F (36.7 C), temperature source Oral, resp. rate 18, height 5\' 3"  (1.6 m), weight 201 lb 4 oz (91.286 kg), SpO2 96.00%, not currently breastfeeding.  Physical Exam:  General: alert and cooperative Lochia: appropriate Uterine Fundus: nontender unable to feel top of fundus DVT Evaluation: No evidence of DVT seen on physical exam. Negative Homan's sign. Ext: no edema. Reflexes 2+ no clonus  Labs: Pr/Cr ratio on admit found, 0.07, within normal limits  Basename 11/08/10 1803  HGB 10.9*  HCT 33.6*    Assessment/Plan: 1. Postpartum HTN: SBP elevated and  labile. No other feature of pre-eclampsia on PE. Patient on labetolol 400 mg BID and norvasc 10 mg q D.  Plan: Consider Adding thiazide diuretic with the goal of SBP <150. Would begin to titrate up on thiazide diuretic and norvasc and decreased labetolol with goal of D/C since pt is brady.  Continue to monitor BP and titrate regimen as needed. May discharge to home once SBP is trending down.     LOS: 3 days   Marissa Doyle 11/11/2010, 7:38 AM     -Discussed BP management with rounding team. Plan to d/c labetalol. Will adjust HCTZ and norvasc.

## 2010-11-12 LAB — BASIC METABOLIC PANEL
GFR calc Af Amer: >60 mL/min (ref >60)
GFR calc non Af Amer: >60 mL/min (ref >60)
Glucose, Bld: 83 mg/dL (ref 70–99)
Potassium: 4 mEq/L (ref 3.5–5.1)
Sodium: 136 mEq/L (ref 135–145)

## 2010-11-12 MED ORDER — HYDROCHLOROTHIAZIDE 25 MG PO TABS: 25.0000 mg | ORAL_TABLET | Freq: Every day | ORAL | Status: DC

## 2010-11-12 MED ORDER — AMLODIPINE BESYLATE 10 MG PO TABS: 10.0000 mg | ORAL_TABLET | Freq: Every day | ORAL | Status: DC

## 2010-11-12 NOTE — Discharge Summary (Signed)
Obstetric Discharge Summary Reason for Admission: post partum hypertension Postpartum Procedures: none Complications-Operative and Postpartum: HTN Hemoglobin  Date Value Range Status  11/08/2010 10.9* 12.0-15.0 (g/dL) Final     HCT  Date Value Range Status  11/08/2010 33.6* 36.0-46.0 (%) Final    Discharge Diagnoses: Postpartum hypertension  Discharge Information: Date: 11/12/2010 Activity: unrestricted Diet: low salt, heart healthy Medications: HCTZ and Norvasc Condition: improved Instructions: refer to practice specific booklet Discharge to: home Follow-up Information    Follow up with [facility] in 2 days. (For Home BP check)       [facility]  in [facility] weeks. (Post partum visit )          Newborn Data: Home with mother.  FUNCHES,JOSALYN 11/12/2010, 9:22 AM

## 2010-11-12 NOTE — Progress Notes (Signed)
Post Partum Day 12, admitted 11/06/10 for severe range postpartum HTN.  Now s/p Magnesium sulfate x 24 hours, being treated with HCTZ and Norvasc.   Subjective: no complaints, up ad lib, voiding and tolerating PO  Denies headache , vision changes or ruq pain, denies leg pain. Baby is doing well and is home now. Has no questions.   Objective: Current BP 153/77, 24 hr SBP 148-169/77-94 Blood pressure 153/77, pulse 70, temperature 98.3 F (36.8 C), temperature source Oral, resp. rate 18, height 5\' 3"  (1.6 m), weight 201 lb 4 oz (91.286 kg), SpO2 100.00%, not currently breastfeeding.  Physical Exam:  General: asleep, arousable,  alert and cooperative Lochia: appropriate Uterine Fundus: nontender unable to feel top of fundus DVT Evaluation: No evidence of DVT seen on physical exam. Negative Homan's sign. Ext: no edema. Reflexes 2+ no clonus  Labs: Pr/Cr ratio on admit found, 0.07, within normal limits No results found for this basename: HGB:2,HCT:2 in the last 72 hours BMET    Component Value Date/Time   NA 136 11/12/2010 0515   K 4.0 11/12/2010 0515   CL 100 11/12/2010 0515   CO2 28 11/12/2010 0515   GLUCOSE 83 11/12/2010 0515   BUN 14 11/12/2010 0515   CREATININE 0.94 11/12/2010 0515   CALCIUM 9.5 11/12/2010 0515   GFRNONAA >60 11/12/2010 0515   GFRAA >60 11/12/2010 0515     Assessment/Plan: 1. Postpartum HTN: BP are stabilizing this AM. No features of pre-eclampsia on PE. Patient on HCTZ 25 mg and norvasc 10 mg q D.  Plan:   Continue to monitor BP and titrate regimen as needed. Anticipate discharge this AM. Will follow 9 AM BP. \ill d/c to home with instructions to continue medications, monitor BP at home (pt will have to buy home BP monitor or go to a drug store). F/u: 1-2 weeks at [facility]    LOS: 4 days   Marissa Doyle 11/12/2010, 7:24 AM       HPI   Review of Systems   Physical Exam

## 2010-11-18 NOTE — Progress Notes (Signed)
Encounter addended by: Tereso Newcomer, MD on: 11/18/2010  9:42 PM<BR>     Documentation filed: Charges VN

## 2010-12-09 ENCOUNTER — Encounter: Payer: Self-pay | Admitting: Obstetrics and Gynecology

## 2010-12-09 ENCOUNTER — Ambulatory Visit (INDEPENDENT_AMBULATORY_CARE_PROVIDER_SITE_OTHER): Payer: Medicaid Other | Admitting: Obstetrics and Gynecology

## 2010-12-09 DIAGNOSIS — Z3049 Encounter for surveillance of other contraceptives: Secondary | ICD-10-CM

## 2010-12-09 DIAGNOSIS — I10 Essential (primary) hypertension: Secondary | ICD-10-CM

## 2010-12-09 DIAGNOSIS — E039 Hypothyroidism, unspecified: Secondary | ICD-10-CM

## 2010-12-09 DIAGNOSIS — D649 Anemia, unspecified: Secondary | ICD-10-CM

## 2010-12-09 LAB — CBC
Hemoglobin: 11.7 g/dL — ABNORMAL LOW (ref 12.0–15.0)
MCHC: 32.4 g/dL (ref 30.0–36.0)
Platelets: 258 10*3/uL (ref 150–400)

## 2010-12-09 LAB — T3, FREE: T3, Free: 2.2 pg/mL — ABNORMAL LOW (ref 2.3–4.2)

## 2010-12-09 LAB — T4, FREE: Free T4: 1.04 ng/dL (ref 0.80–1.80)

## 2010-12-09 MED ORDER — MEDROXYPROGESTERONE ACETATE 150 MG/ML IM SUSP
150.0000 mg | INTRAMUSCULAR | Status: AC
  Administered 2010-12-09: 150 mg via INTRAMUSCULAR

## 2010-12-09 NOTE — Progress Notes (Signed)
Addended by: Sherre Lain A on: 12/09/2010 03:00 PM   Modules accepted: Orders

## 2010-12-09 NOTE — Progress Notes (Deleted)
Subjective:     Patient ID: Marissa Doyle, female   DOB: Jun 18, 1980, 30 y.o.   MRN: [id number]  HPI   Review of Systems     Objective:   Physical Exam     Assessment:     ***    Plan:     ***

## 2010-12-09 NOTE — Progress Notes (Signed)
Subjective:     Patient ID: Marissa Doyle, female   DOB: 1980-12-02, 30 y.o.   MRN: [id number]  HPI postpartum   Review of Systems hypertension, hypothyroidism, anemia.     Objective:   Physical Exam on examination the patient was period started today was bleeding too heavily to do a Pap smear. She's getting Depo-Provera and when she comes in in 3 months we'll do a Pap smear at that time.     Assessment:    successful recovery 6 weeks postpartum.    Plan:    continue hydrochlorothiazide. Evaluate thyroid(patient currently on Synthroid 0.175). Evaluate anemia. Start Depo-Provera and have the patient return in 3 months for Pap smear when she gets her second dose.

## 2010-12-09 NOTE — Progress Notes (Deleted)
Re-checked blood pressure 157/93 pulse 87. Pt states has not taken blood pressure med today

## 2010-12-13 LAB — DIFFERENTIAL
Basophils Absolute: 0
Basophils Relative: 0
Lymphocytes Relative: 9 — ABNORMAL LOW
Neutro Abs: 8.2 — ABNORMAL HIGH
Neutrophils Relative %: 86 — ABNORMAL HIGH

## 2010-12-13 LAB — CBC
MCHC: 34.1
Platelets: 199
RDW: 12.9

## 2010-12-13 LAB — BASIC METABOLIC PANEL
BUN: 11
Calcium: 9.6
Creatinine, Ser: 0.84
GFR calc non Af Amer: >60
Glucose, Bld: 102 — ABNORMAL HIGH
Sodium: 136

## 2010-12-13 LAB — RPR: RPR Ser Ql: NONREACTIVE

## 2011-01-07 ENCOUNTER — Other Ambulatory Visit: Payer: Self-pay | Admitting: Obstetrics and Gynecology

## 2012-01-21 ENCOUNTER — Emergency Department (HOSPITAL_COMMUNITY)
Admission: EM | Admit: 2012-01-21 | Discharge: 2012-01-21 | Disposition: A | Discharge status: Home / Self Care | Payer: Self-pay | Source: Home / Self Care | Attending: Emergency Medicine | Admitting: Emergency Medicine

## 2012-01-21 ENCOUNTER — Encounter (HOSPITAL_COMMUNITY): Payer: Self-pay

## 2012-01-21 DIAGNOSIS — J36 Peritonsillar abscess: Secondary | ICD-10-CM

## 2012-01-21 HISTORY — DX: Essential (primary) hypertension: I10

## 2012-01-21 MED ORDER — CEFTRIAXONE SODIUM 1 G IJ SOLR
1.0000 g | Freq: Once | INTRAMUSCULAR | Status: AC
  Administered 2012-01-21: 1 g via INTRAMUSCULAR

## 2012-01-21 MED ORDER — HYDROCODONE-ACETAMINOPHEN 5-325 MG PO TABS
2.0000 | ORAL_TABLET | Freq: Once | ORAL | Status: AC
  Administered 2012-01-21: 2 via ORAL

## 2012-01-21 MED ORDER — HYDROCODONE-ACETAMINOPHEN 5-325 MG PO TABS
ORAL_TABLET | ORAL | Status: AC
  Filled 2012-01-21: qty 2

## 2012-01-21 MED ORDER — CEFTRIAXONE SODIUM 1 G IJ SOLR
INTRAMUSCULAR | Status: AC
  Filled 2012-01-21: qty 10

## 2012-01-21 MED ORDER — PREDNISONE 20 MG PO TABS
ORAL_TABLET | ORAL | Status: AC
  Filled 2012-01-21: qty 2

## 2012-01-21 MED ORDER — PREDNISONE 20 MG PO TABS
40.0000 mg | ORAL_TABLET | Freq: Once | ORAL | Status: AC
  Administered 2012-01-21: 40 mg via ORAL

## 2012-01-21 MED ORDER — LIDOCAINE HCL (PF) 1 % IJ SOLN
INTRAMUSCULAR | Status: AC
  Filled 2012-01-21: qty 5

## 2012-01-21 NOTE — ED Notes (Signed)
Discussed f/u at Dr Thurmon Fair office at 3 pm today; instructed to go to the ED if her syx worsen in the meanwhile

## 2012-01-21 NOTE — ED Notes (Signed)
3 day duration of ST, minimal relief w OTC cough remedies ; swollen, red tonsils, pain in ears

## 2012-01-21 NOTE — ED Provider Notes (Addendum)
History     CSN: [id number]  Arrival date & time 01/21/12  1056   First MD Initiated Contact with Patient 01/21/12 1108      Chief Complaint  Patient presents with  . Sore Throat    (Consider location/radiation/quality/duration/timing/severity/associated sxs/prior treatment) HPI Comments: Patient presents urgent care this early afternoon complaining of a 4 day duration of a sore throat that has progressively gotten worse she also describes that both of her ears have been hurting and that she has had a cold for about 3-4 days as well. She describes it now is having difficulty swallowing and does describe increase salivation. She is uncertain about whether or not she has had fevers she has felt cold at times. Patient does describe that her voice has changed some. She denies any shortness of breath, and is still swallowing fluids and tablets with difficulty secondary to pain. Patient denies having had any infections similar to this in the past she does admit that she is a active smoker.  The history is provided by the patient.    Past Medical History  Diagnosis Date  . 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 Ovarian cyst   . Ectopic pregnancy     Received MTX  . Gonorrhea   . Chlamydia   . Trichomonas   . Abnormal Pap smear   . Anemia   . Hypertension     History reviewed. No pertinent past surgical history.  Family History  Problem Relation Age of Onset  . Hypertension Mother   . Hypertension Sister     History  Substance Use Topics  . Smoking status: Current Every Day Smoker -- 0.2 packs/day for 10 years    Types: Cigarettes  . Smokeless tobacco: Never Used  . Alcohol Use: Yes    OB History    Grav Para Term Preterm Abortions TAB SAB Ect Mult Living   5 4 4  0 1 0 0 1 0 4      Review of Systems  Constitutional: Positive for activity change and appetite change.  HENT: Positive for congestion, sore throat, drooling, trouble swallowing, neck pain and voice  change. Negative for neck stiffness.   Respiratory: Negative for cough and shortness of breath.   Neurological: Negative for dizziness, weakness, numbness and headaches.    Allergies  Review of patient's allergies indicates no known allergies.  Home Medications   Current Outpatient Rx  Name  Route  Sig  Dispense  Refill  . AMLODIPINE BESYLATE 10 MG PO TABS   Oral   Take 1 tablet (10 mg total) by mouth daily.   30 tablet   2   . HYDROCHLOROTHIAZIDE 25 MG PO TABS   Oral   Take 1 tablet (25 mg total) by mouth daily.   30 tablet   2   . LEVOTHYROXINE SODIUM 175 MCG PO TABS   Oral   Take 175 mcg by mouth daily.           Marland Kitchen PRENATAL PLUS 27-1 MG PO TABS   Oral   Take 1 tablet by mouth daily.             BP 139/92  Pulse 86  Temp 99.5 F (37.5 C) (Oral)  Resp 24  SpO2 100%  Physical Exam  Nursing note and vitals reviewed. Constitutional: She appears distressed.  HENT:  Head: Normocephalic. No trismus in the jaw.  Right Ear: Hearing, external ear and ear canal normal. Tympanic membrane is injected.  Tympanic membrane is not perforated, not retracted and not bulging. No middle ear effusion.  Left Ear: Hearing, external ear and ear canal normal. Tympanic membrane is injected. Tympanic membrane is not perforated, not retracted and not bulging.  No middle ear effusion.  Mouth/Throat: Oropharyngeal exudate, posterior oropharyngeal erythema and tonsillar abscesses present.    Eyes: Conjunctivae normal are normal. Pupils are equal, round, and reactive to light.  Neck: Neck supple. No JVD present. No tracheal deviation present. No Kernig's sign noted.  Pulmonary/Chest: Effort normal and breath sounds normal.  Lymphadenopathy:    She has cervical adenopathy.  Neurological: She is alert.  Skin: Skin is warm. No rash noted.    ED Course  Procedures (including critical care time)  Labs Reviewed  POCT RAPID STREP A ([facility] ONLY) - Abnormal; Notable for the  following:    Streptococcus, Group A Screen (Direct) POSITIVE (*)     All other components within normal limits   No results found.   1. Peritonsillar abscess       MDM  Right peritonsillar abscess. I have tried to reach Dr. Annalee Genta on-call ENT provider today, spoke with office representatives and they have made arrangements for patient to be seen at 3 PM today. While at the office patient had pain management with Lortab, a dose of prednisone 40 mg, and at ceftriaxone IM. Patient was instructed to see ENT provider today at 3 PM and was also instructed about symptoms that would warrant further evaluation in the emergency department. Patient agrees with treatment plan and followup care at the ENT office today this afternoon for a medical procedure patient was explained that clinically she exhibited signs of a peritonsillar abscess and we discussed some of the complications if this infection goes untreated        Jimmie Molly, MD 01/21/12 1334  Jimmie Molly, MD 01/21/12 [phone]

## 2012-02-17 ENCOUNTER — Emergency Department (HOSPITAL_COMMUNITY)
Admission: EM | Admit: 2012-02-17 | Discharge: 2012-02-17 | Disposition: A | Discharge status: Home / Self Care | Payer: Self-pay | Attending: Emergency Medicine | Admitting: Emergency Medicine

## 2012-02-17 ENCOUNTER — Encounter (HOSPITAL_COMMUNITY): Payer: Self-pay | Admitting: Emergency Medicine

## 2012-02-17 DIAGNOSIS — Z8619 Personal history of other infectious and parasitic diseases: Secondary | ICD-10-CM | POA: Insufficient documentation

## 2012-02-17 DIAGNOSIS — Z79899 Other long term (current) drug therapy: Secondary | ICD-10-CM | POA: Insufficient documentation

## 2012-02-17 DIAGNOSIS — R1110 Vomiting, unspecified: Secondary | ICD-10-CM | POA: Insufficient documentation

## 2012-02-17 DIAGNOSIS — I10 Essential (primary) hypertension: Secondary | ICD-10-CM | POA: Insufficient documentation

## 2012-02-17 DIAGNOSIS — E039 Hypothyroidism, unspecified: Secondary | ICD-10-CM | POA: Insufficient documentation

## 2012-02-17 DIAGNOSIS — Z8742 Personal history of other diseases of the female genital tract: Secondary | ICD-10-CM | POA: Insufficient documentation

## 2012-02-17 DIAGNOSIS — Z862 Personal history of diseases of the blood and blood-forming organs and certain disorders involving the immune mechanism: Secondary | ICD-10-CM | POA: Insufficient documentation

## 2012-02-17 DIAGNOSIS — R51 Headache: Secondary | ICD-10-CM | POA: Insufficient documentation

## 2012-02-17 DIAGNOSIS — F17200 Nicotine dependence, unspecified, uncomplicated: Secondary | ICD-10-CM | POA: Insufficient documentation

## 2012-02-17 MED ORDER — ONDANSETRON HCL 4 MG/2ML IJ SOLN
4.0000 mg | Freq: Once | INTRAMUSCULAR | Status: AC
  Administered 2012-02-17: 4 mg via INTRAVENOUS
  Filled 2012-02-17: qty 2

## 2012-02-17 MED ORDER — METOCLOPRAMIDE HCL 5 MG/ML IJ SOLN
10.0000 mg | Freq: Once | INTRAMUSCULAR | Status: AC
  Administered 2012-02-17: 10 mg via INTRAVENOUS

## 2012-02-17 MED ORDER — SODIUM CHLORIDE 0.9 % IV BOLUS (SEPSIS)
1000.0000 mL | Freq: Once | INTRAVENOUS | Status: AC
  Administered 2012-02-17: 1000 mL via INTRAVENOUS

## 2012-02-17 MED ORDER — METOCLOPRAMIDE HCL 5 MG/ML IJ SOLN
10.0000 mg | Freq: Once | INTRAMUSCULAR | Status: DC
  Filled 2012-02-17: qty 2

## 2012-02-17 MED ORDER — MORPHINE SULFATE 4 MG/ML IJ SOLN
6.0000 mg | Freq: Once | INTRAMUSCULAR | Status: AC
  Administered 2012-02-17: 4 mg via INTRAVENOUS
  Filled 2012-02-17: qty 2

## 2012-02-17 MED ORDER — DEXAMETHASONE SODIUM PHOSPHATE 10 MG/ML IJ SOLN
10.0000 mg | Freq: Once | INTRAMUSCULAR | Status: AC
  Administered 2012-02-17: 10 mg via INTRAVENOUS
  Filled 2012-02-17: qty 1

## 2012-02-17 MED ORDER — DIPHENHYDRAMINE HCL 50 MG/ML IJ SOLN
25.0000 mg | Freq: Once | INTRAMUSCULAR | Status: AC
  Administered 2012-02-17: 25 mg via INTRAVENOUS
  Filled 2012-02-17: qty 1

## 2012-02-17 MED ORDER — MORPHINE SULFATE 4 MG/ML IJ SOLN
6.0000 mg | Freq: Once | INTRAMUSCULAR | Status: AC
  Administered 2012-02-17: 6 mg via INTRAVENOUS
  Filled 2012-02-17: qty 2

## 2012-02-17 MED ORDER — KETOROLAC TROMETHAMINE 30 MG/ML IJ SOLN
30.0000 mg | Freq: Once | INTRAMUSCULAR | Status: AC
  Administered 2012-02-17: 30 mg via INTRAVENOUS
  Filled 2012-02-17: qty 1

## 2012-02-17 MED ORDER — OXYCODONE-ACETAMINOPHEN 5-325 MG PO TABS: ORAL_TABLET | ORAL | Status: DC

## 2012-02-17 NOTE — ED Provider Notes (Signed)
Medical screening examination/treatment/procedure(s) were performed by non-physician practitioner and as supervising physician I was immediately available for consultation/collaboration.   Gavin Pound. Oletta Lamas, MD 02/17/12 2219

## 2012-02-17 NOTE — ED Notes (Signed)
States thaT SHE HAS BEEN TAKING HER BP MEDS AS SHE SHOuld and did have migrains but they gt better w/ thyroid med

## 2012-02-17 NOTE — ED Provider Notes (Signed)
History     CSN: [id number]  Arrival date & time 02/17/12  1247   First MD Initiated Contact with Patient 02/17/12 1434      Chief Complaint  Patient presents with  . Headache    (Consider location/radiation/quality/duration/timing/severity/associated sxs/prior treatment) HPI  Marissa Doyle is a 31 y.o. female past medical history significant for hypothyroid, hypertension and is a cigarette smoker complaining of frontal HA x3 days described as throbbing, rated at 10/10 at worst, exacerbated by standing and valsalva. Indolent onset. 1 ibuprofen 1 x per day with no relief. 1 episode of emesis last night efter she topok iboprefen on an empty stomach. Not nauseated now. Denies photophobia, phonophobia, meningismus, fever dysarthria, paresthesia, unilateral weakness. Patient has had a history of headaches; never evaluated by neurologist. She really hasn't had a bad headache in the last 5 years. They were typically unilateral and associated with photo and phonophobia.   Past Medical History  Diagnosis Date  . 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 Ovarian cyst   . Ectopic pregnancy     Received MTX  . Gonorrhea   . Chlamydia   . Trichomonas   . Abnormal Pap smear   . Anemia   . Hypertension     History reviewed. No pertinent past surgical history.  Family History  Problem Relation Age of Onset  . Hypertension Mother   . Hypertension Sister     History  Substance Use Topics  . Smoking status: Current Every Day Smoker -- 0.2 packs/day for 10 years    Types: Cigarettes  . Smokeless tobacco: Never Used  . Alcohol Use: Yes    OB History    Grav Para Term Preterm Abortions TAB SAB Ect Mult Living   5 4 4  0 1 0 0 1 0 4      Review of Systems  Constitutional: Negative for fever.  Respiratory: Negative for shortness of breath.   Cardiovascular: Negative for chest pain.  Gastrointestinal: Negative for nausea, vomiting, abdominal pain and diarrhea.  Neurological: Positive  for headaches. Negative for tremors, syncope, facial asymmetry, speech difficulty and weakness.  All other systems reviewed and are negative.    Allergies  Review of patient's allergies indicates no known allergies.  Home Medications   Current Outpatient Rx  Name  Route  Sig  Dispense  Refill  . AMLODIPINE BESYLATE 10 MG PO TABS   Oral   Take 10 mg by mouth daily.         Marland Kitchen HYDROCHLOROTHIAZIDE 25 MG PO TABS   Oral   Take 25 mg by mouth daily.         Marland Kitchen LEVOTHYROXINE SODIUM 175 MCG PO TABS   Oral   Take 175 mcg by mouth daily.             BP 143/92  Pulse 106  Temp 99.2 F (37.3 C)  Resp 16  SpO2 100%  Physical Exam  Nursing note and vitals reviewed. Constitutional: She is oriented to person, place, and time. She appears well-developed and well-nourished. No distress.  HENT:  Head: Normocephalic.  Eyes: Conjunctivae normal and EOM are normal. Pupils are equal, round, and reactive to light.  Neck: Normal range of motion. Neck supple.  Cardiovascular: Normal rate, regular rhythm and intact distal pulses.   Pulmonary/Chest: Effort normal and breath sounds normal. No stridor. No respiratory distress. She has no wheezes. She has no rales. She exhibits no tenderness.  Abdominal: Soft. Bowel sounds  are normal.  Musculoskeletal: Normal range of motion.  Neurological: She is alert and oriented to person, place, and time.       Cranial nerves III through XII intact, strength 5 out of 5x4 extremities, negative pronator drift, finger to nose and heel-to-shin coordinated, sensation intact to pinprick and light touch, gait is coordinated and Romberg is negative.    Psychiatric: She has a normal mood and affect.    ED Course  Procedures (including critical care time)  Labs Reviewed - No data to display No results found.  4:56 PM patient reports minimal improvement with a cocktail. I'll give her morphine and Zofran.  5:59 PM does report significant improvement  1.  Headache       MDM   patient with headache and normal neuro exam. Pain is well-controlled.  Discussed case with attending who agrees with plan and stability to d/c to home.    Pt verbalized understanding and agrees with care plan. Outpatient follow-up and return precautions given.    New Prescriptions   OXYCODONE-ACETAMINOPHEN (PERCOCET/ROXICET) 5-325 MG PER TABLET    1 to 2 tabs PO q6hrs  PRN for pain          Wynetta Emery, PA-C 02/17/12 1824

## 2012-02-17 NOTE — ED Notes (Signed)
Pt requested 4mg  of pain medication instead of the 6mg 

## 2012-02-17 NOTE — ED Notes (Signed)
H/a x 3 days some nausea has htn

## 2013-07-12 ENCOUNTER — Encounter (HOSPITAL_COMMUNITY): Payer: Self-pay | Admitting: Emergency Medicine

## 2013-07-12 ENCOUNTER — Emergency Department (HOSPITAL_COMMUNITY)
Admission: EM | Admit: 2013-07-12 | Discharge: 2013-07-12 | Disposition: A | Discharge status: Home / Self Care | Payer: Medicaid Other | Source: Home / Self Care | Attending: Family Medicine | Admitting: Family Medicine

## 2013-07-12 DIAGNOSIS — I10 Essential (primary) hypertension: Secondary | ICD-10-CM

## 2013-07-12 LAB — POCT I-STAT, CHEM 8
BUN: 18 mg/dL (ref 6–23)
Calcium, Ion: 1.14 mmol/L (ref 1.12–1.23)
Chloride: 100 mEq/L (ref 96–112)
Creatinine, Ser: 1.4 mg/dL — ABNORMAL HIGH (ref 0.50–1.10)
Glucose, Bld: 89 mg/dL (ref 70–99)
HCT: 39 % (ref 36.0–46.0)
Hemoglobin: 13.3 g/dL (ref 12.0–15.0)
Potassium: 3.5 mEq/L — ABNORMAL LOW (ref 3.7–5.3)
Sodium: 137 mEq/L (ref 137–147)
TCO2: 25 mmol/L (ref 0–100)

## 2013-07-12 MED ORDER — AMLODIPINE BESYLATE 10 MG PO TABS: 10.0000 mg | ORAL_TABLET | Freq: Every day | ORAL | Status: DC

## 2013-07-12 NOTE — ED Provider Notes (Signed)
CSN: [id number]     Arrival date & time 07/12/13  1421 History   First MD Initiated Contact with Patient 07/12/13 1607     Chief Complaint  Patient presents with  . Hypertension  . Medication Refill   (Consider location/radiation/quality/duration/timing/severity/associated sxs/prior Treatment) Patient is a 33 y.o. female presenting with hypertension. The history is provided by the patient.  Hypertension This is a chronic problem. Episode onset: out of meds for 6-8 mos, now noticing hand and feet swelling. also smokes. The problem has been gradually worsening. Pertinent negatives include no chest pain, no headaches and no shortness of breath.    Past Medical History  Diagnosis Date  . 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 Ovarian cyst   . Ectopic pregnancy     Received MTX  . Gonorrhea   . Chlamydia   . Trichomonas   . Abnormal Pap smear   . Anemia   . Hypertension    History reviewed. No pertinent past surgical history. Family History  Problem Relation Age of Onset  . Hypertension Mother   . Hypertension Sister    History  Substance Use Topics  . Smoking status: Current Every Day Smoker -- 0.25 packs/day for 10 years    Types: Cigarettes  . Smokeless tobacco: Never Used  . Alcohol Use: Yes   OB History   Grav Para Term Preterm Abortions TAB SAB Ect Mult Living   5 4 4  0 1 0 0 1 0 4     Review of Systems  Constitutional: Negative.   HENT: Negative.   Respiratory: Negative for chest tightness, shortness of breath and wheezing.   Cardiovascular: Positive for leg swelling. Negative for chest pain and palpitations.  Neurological: Negative for dizziness and headaches.    Allergies  Review of patient's allergies indicates no known allergies.  Home Medications   Prior to Admission medications   Medication Sig Start Date End Date Taking? Authorizing Provider  amLODipine (NORVASC) 10 MG tablet Take 10 mg by mouth daily.    Historical Provider, MD   hydrochlorothiazide (HYDRODIURIL) 25 MG tablet Take 25 mg by mouth daily.    Historical Provider, MD  levothyroxine (SYNTHROID, LEVOTHROID) 175 MCG tablet Take 175 mcg by mouth daily.      Historical Provider, MD  oxyCODONE-acetaminophen (PERCOCET/ROXICET) 5-325 MG per tablet 1 to 2 tabs PO q6hrs  PRN for pain 02/17/12   Nicole Pisciotta, PA-C   BP 178/98  Pulse 90  Temp(Src) 98.2 F (36.8 C) (Oral)  Resp 16  SpO2 98% Physical Exam  Nursing note and vitals reviewed. Constitutional: She is oriented to person, place, and time. She appears well-developed and well-nourished. No distress.  HENT:  Head: Normocephalic.  Right Ear: External ear normal.  Left Ear: External ear normal.  Mouth/Throat: Oropharynx is clear and moist.  Eyes: Conjunctivae are normal. Pupils are equal, round, and reactive to light.  Neck: Normal range of motion. Neck supple.  Cardiovascular: Normal heart sounds and intact distal pulses.   Pulmonary/Chest: Effort normal and breath sounds normal.  Musculoskeletal: She exhibits no edema.  Lymphadenopathy:    She has no cervical adenopathy.  Neurological: She is alert and oriented to person, place, and time.  Skin: Skin is warm and dry.    ED Course  Procedures (including critical care time) Labs Review Labs Reviewed  POCT I-STAT, CHEM 8 - Abnormal; Notable for the following:    Potassium 3.5 (*)    Creatinine, Ser 1.40 (*)  All other components within normal limits   i-stat--see report. Imaging Review No results found.   MDM   1. Hypertension goal BP (blood pressure) < 140/80        Linna HoffJames D Javien Tesch, MD 07/12/13 [phone]

## 2013-07-12 NOTE — Discharge Instructions (Signed)
Take your medicine as prescribed, you must stop smoking, avoid salt, drink plenty of water, see your doctor in 1 mo for recheck of bp.

## 2014-01-09 ENCOUNTER — Encounter (HOSPITAL_COMMUNITY): Payer: Self-pay | Admitting: Emergency Medicine

## 2021-08-19 ENCOUNTER — Encounter (HOSPITAL_COMMUNITY): Payer: Self-pay

## 2021-08-19 ENCOUNTER — Inpatient Hospital Stay (HOSPITAL_COMMUNITY)
Admission: EM | Admit: 2021-08-19 | Discharge: 2021-08-21 | DRG: 392 | Disposition: A | Discharge status: Home / Self Care | Payer: Medicaid Other | Attending: Internal Medicine | Admitting: Internal Medicine

## 2021-08-19 ENCOUNTER — Emergency Department (HOSPITAL_COMMUNITY): Payer: Medicaid Other

## 2021-08-19 ENCOUNTER — Other Ambulatory Visit: Payer: Self-pay

## 2021-08-19 ENCOUNTER — Ambulatory Visit (HOSPITAL_COMMUNITY)
Admission: RE | Admit: 2021-08-19 | Discharge: 2021-08-19 | Disposition: A | Discharge status: Home / Self Care | Payer: Medicaid Other | Source: Ambulatory Visit | Attending: Emergency Medicine | Admitting: Emergency Medicine

## 2021-08-19 VITALS — BP 127/81 | HR 102 | Temp 98.9°F | Resp 20

## 2021-08-19 DIAGNOSIS — R42 Dizziness and giddiness: Secondary | ICD-10-CM | POA: Diagnosis present

## 2021-08-19 DIAGNOSIS — E876 Hypokalemia: Secondary | ICD-10-CM | POA: Diagnosis present

## 2021-08-19 DIAGNOSIS — B9689 Other specified bacterial agents as the cause of diseases classified elsewhere: Secondary | ICD-10-CM

## 2021-08-19 DIAGNOSIS — E46 Unspecified protein-calorie malnutrition: Secondary | ICD-10-CM | POA: Diagnosis present

## 2021-08-19 DIAGNOSIS — H109 Unspecified conjunctivitis: Secondary | ICD-10-CM

## 2021-08-19 DIAGNOSIS — K760 Fatty (change of) liver, not elsewhere classified: Secondary | ICD-10-CM | POA: Diagnosis present

## 2021-08-19 DIAGNOSIS — R1084 Generalized abdominal pain: Secondary | ICD-10-CM | POA: Diagnosis not present

## 2021-08-19 DIAGNOSIS — T2050XA Corrosion of first degree of head, face, and neck, unspecified site, initial encounter: Secondary | ICD-10-CM | POA: Diagnosis present

## 2021-08-19 DIAGNOSIS — Z923 Personal history of irradiation: Secondary | ICD-10-CM

## 2021-08-19 DIAGNOSIS — R000 Tachycardia, unspecified: Secondary | ICD-10-CM

## 2021-08-19 DIAGNOSIS — H1089 Other conjunctivitis: Secondary | ICD-10-CM | POA: Diagnosis present

## 2021-08-19 DIAGNOSIS — F17210 Nicotine dependence, cigarettes, uncomplicated: Secondary | ICD-10-CM | POA: Diagnosis present

## 2021-08-19 DIAGNOSIS — T6591XA Toxic effect of unspecified substance, accidental (unintentional), initial encounter: Secondary | ICD-10-CM | POA: Diagnosis present

## 2021-08-19 DIAGNOSIS — K529 Noninfective gastroenteritis and colitis, unspecified: Secondary | ICD-10-CM | POA: Diagnosis present

## 2021-08-19 DIAGNOSIS — Z6838 Body mass index (BMI) 38.0-38.9, adult: Secondary | ICD-10-CM

## 2021-08-19 DIAGNOSIS — R7401 Elevation of levels of liver transaminase levels: Secondary | ICD-10-CM | POA: Diagnosis present

## 2021-08-19 DIAGNOSIS — E669 Obesity, unspecified: Secondary | ICD-10-CM | POA: Diagnosis present

## 2021-08-19 DIAGNOSIS — K8070 Calculus of gallbladder and bile duct without cholecystitis without obstruction: Secondary | ICD-10-CM | POA: Diagnosis present

## 2021-08-19 DIAGNOSIS — Z793 Long term (current) use of hormonal contraceptives: Secondary | ICD-10-CM

## 2021-08-19 DIAGNOSIS — Z8249 Family history of ischemic heart disease and other diseases of the circulatory system: Secondary | ICD-10-CM

## 2021-08-19 DIAGNOSIS — F1090 Alcohol use, unspecified, uncomplicated: Secondary | ICD-10-CM | POA: Diagnosis present

## 2021-08-19 DIAGNOSIS — D539 Nutritional anemia, unspecified: Secondary | ICD-10-CM | POA: Diagnosis present

## 2021-08-19 DIAGNOSIS — E039 Hypothyroidism, unspecified: Secondary | ICD-10-CM | POA: Diagnosis present

## 2021-08-19 DIAGNOSIS — I10 Essential (primary) hypertension: Secondary | ICD-10-CM | POA: Diagnosis present

## 2021-08-19 DIAGNOSIS — A09 Infectious gastroenteritis and colitis, unspecified: Principal | ICD-10-CM | POA: Diagnosis present

## 2021-08-19 DIAGNOSIS — K50019 Crohn's disease of small intestine with unspecified complications: Secondary | ICD-10-CM

## 2021-08-19 LAB — CBC WITH DIFFERENTIAL/PLATELET
Abs Immature Granulocytes: 0.11 10*3/uL — ABNORMAL HIGH (ref 0.00–0.07)
Basophils Absolute: 0 10*3/uL (ref 0.0–0.1)
Basophils Relative: 0 %
Eosinophils Absolute: 0 10*3/uL (ref 0.0–0.5)
Eosinophils Relative: 0 %
HCT: 29.5 % — ABNORMAL LOW (ref 36.0–46.0)
Hemoglobin: 9.7 g/dL — ABNORMAL LOW (ref 12.0–15.0)
Immature Granulocytes: 1 %
Lymphocytes Relative: 17 %
Lymphs Abs: 1.3 10*3/uL (ref 0.7–4.0)
MCH: 33 pg (ref 26.0–34.0)
MCHC: 32.9 g/dL (ref 30.0–36.0)
MCV: 100.3 fL — ABNORMAL HIGH (ref 80.0–100.0)
Monocytes Absolute: 0.8 10*3/uL (ref 0.1–1.0)
Monocytes Relative: 10 %
Neutro Abs: 5.5 10*3/uL (ref 1.7–7.7)
Neutrophils Relative %: 72 %
Platelets: 182 10*3/uL (ref 150–400)
RBC: 2.94 MIL/uL — ABNORMAL LOW (ref 3.87–5.11)
RDW: 22.1 % — ABNORMAL HIGH (ref 11.5–15.5)
WBC: 7.8 10*3/uL (ref 4.0–10.5)
nRBC: 0.8 % — ABNORMAL HIGH (ref 0.0–0.2)

## 2021-08-19 LAB — POCT URINALYSIS DIPSTICK, ED / UC
Glucose, UA: 100 mg/dL — AB
Hgb urine dipstick: NEGATIVE
Ketones, ur: 15 mg/dL — AB
Nitrite: POSITIVE — AB
Protein, ur: >=300 mg/dL — AB
Specific Gravity, Urine: 1.025 (ref 1.005–1.030)
Urobilinogen, UA: >=8 mg/dL (ref 0.0–1.0)
pH: 5.5 (ref 5.0–8.0)

## 2021-08-19 LAB — URINALYSIS, ROUTINE W REFLEX MICROSCOPIC
Bilirubin Urine: NEGATIVE
Glucose, UA: NEGATIVE mg/dL
Hgb urine dipstick: NEGATIVE
Ketones, ur: NEGATIVE mg/dL
Leukocytes,Ua: NEGATIVE
Nitrite: POSITIVE — AB
Protein, ur: 30 mg/dL — AB
Specific Gravity, Urine: >1.046 — ABNORMAL HIGH (ref 1.005–1.030)
pH: 6 (ref 5.0–8.0)

## 2021-08-19 LAB — COMPREHENSIVE METABOLIC PANEL
ALT: 36 U/L (ref 0–44)
AST: 76 U/L — ABNORMAL HIGH (ref 15–41)
Albumin: 3.9 g/dL (ref 3.5–5.0)
Alkaline Phosphatase: 228 U/L — ABNORMAL HIGH (ref 38–126)
Anion gap: 17 — ABNORMAL HIGH (ref 5–15)
BUN: 13 mg/dL (ref 6–20)
CO2: 28 mmol/L (ref 22–32)
Calcium: 8.8 mg/dL — ABNORMAL LOW (ref 8.9–10.3)
Chloride: 90 mmol/L — ABNORMAL LOW (ref 98–111)
Creatinine, Ser: 1.04 mg/dL — ABNORMAL HIGH (ref 0.44–1.00)
GFR, Estimated: >60 mL/min (ref >60)
Glucose, Bld: 115 mg/dL — ABNORMAL HIGH (ref 70–99)
Potassium: 3 mmol/L — ABNORMAL LOW (ref 3.5–5.1)
Sodium: 135 mmol/L (ref 135–145)
Total Bilirubin: 5.5 mg/dL — ABNORMAL HIGH (ref 0.3–1.2)
Total Protein: 7.9 g/dL (ref 6.5–8.1)

## 2021-08-19 LAB — BILIRUBIN, DIRECT: Bilirubin, Direct: 2.9 mg/dL — ABNORMAL HIGH (ref 0.0–0.2)

## 2021-08-19 LAB — LIPASE, BLOOD: Lipase: 76 U/L — ABNORMAL HIGH (ref 11–51)

## 2021-08-19 LAB — MAGNESIUM: Magnesium: 1.4 mg/dL — ABNORMAL LOW (ref 1.7–2.4)

## 2021-08-19 LAB — HCG, QUANTITATIVE, PREGNANCY: hCG, Beta Chain, Quant, S: <1 m[IU]/mL (ref <5)

## 2021-08-19 LAB — POC URINE PREG, ED: Preg Test, Ur: NEGATIVE

## 2021-08-19 MED ORDER — FOLIC ACID 1 MG PO TABS
1.0000 mg | ORAL_TABLET | Freq: Every day | ORAL | Status: DC
  Administered 2021-08-20 – 2021-08-21 (×2): 1 mg via ORAL
  Filled 2021-08-19 (×3): qty 1

## 2021-08-19 MED ORDER — LACTATED RINGERS IV BOLUS
1000.0000 mL | Freq: Once | INTRAVENOUS | Status: AC
  Administered 2021-08-19: 1000 mL via INTRAVENOUS

## 2021-08-19 MED ORDER — CIPROFLOXACIN HCL 0.3 % OP SOLN
2.0000 [drp] | OPHTHALMIC | Status: DC
  Administered 2021-08-19 – 2021-08-21 (×7): 2 [drp] via OPHTHALMIC
  Filled 2021-08-19: qty 2.5

## 2021-08-19 MED ORDER — THIAMINE HCL 100 MG PO TABS
100.0000 mg | ORAL_TABLET | Freq: Once | ORAL | Status: AC
  Administered 2021-08-19: 100 mg via ORAL
  Filled 2021-08-19: qty 1

## 2021-08-19 MED ORDER — IOHEXOL 300 MG/ML  SOLN
100.0000 mL | Freq: Once | INTRAMUSCULAR | Status: AC | PRN
  Administered 2021-08-19: 100 mL via INTRAVENOUS

## 2021-08-19 MED ORDER — OXYCODONE HCL 5 MG PO TABS
5.0000 mg | ORAL_TABLET | Freq: Four times a day (QID) | ORAL | Status: DC | PRN
  Administered 2021-08-19 – 2021-08-21 (×2): 5 mg via ORAL
  Filled 2021-08-19 (×2): qty 1

## 2021-08-19 MED ORDER — POTASSIUM CHLORIDE CRYS ER 20 MEQ PO TBCR
40.0000 meq | EXTENDED_RELEASE_TABLET | Freq: Once | ORAL | Status: AC
  Administered 2021-08-19: 40 meq via ORAL
  Filled 2021-08-19: qty 2

## 2021-08-19 MED ORDER — SODIUM CHLORIDE 0.9 % IV SOLN
INTRAVENOUS | Status: DC | PRN
  Administered 2021-08-20: 100 mL via INTRAVENOUS

## 2021-08-19 MED ORDER — MAGNESIUM SULFATE 2 GM/50ML IV SOLN
2.0000 g | Freq: Once | INTRAVENOUS | Status: AC
Start: 2021-08-19 — End: 2021-08-20
  Administered 2021-08-20: 2 g via INTRAVENOUS
  Filled 2021-08-19: qty 50

## 2021-08-19 MED ORDER — FOLIC ACID 1 MG PO TABS
1.0000 mg | ORAL_TABLET | Freq: Once | ORAL | Status: AC
  Administered 2021-08-19: 1 mg via ORAL
  Filled 2021-08-19: qty 1

## 2021-08-19 MED ORDER — ACETAMINOPHEN 325 MG PO TABS: 650.0000 mg | ORAL_TABLET | Freq: Four times a day (QID) | ORAL | Status: DC | PRN

## 2021-08-19 MED ORDER — KETOROLAC TROMETHAMINE 15 MG/ML IJ SOLN
15.0000 mg | Freq: Once | INTRAMUSCULAR | Status: AC
  Administered 2021-08-19: 15 mg via INTRAVENOUS
  Filled 2021-08-19: qty 1

## 2021-08-19 MED ORDER — MORPHINE SULFATE (PF) 4 MG/ML IV SOLN
4.0000 mg | Freq: Once | INTRAVENOUS | Status: AC
  Administered 2021-08-19: 4 mg via INTRAVENOUS
  Filled 2021-08-19: qty 1

## 2021-08-19 MED ORDER — ONDANSETRON HCL 4 MG/2ML IJ SOLN
4.0000 mg | Freq: Four times a day (QID) | INTRAMUSCULAR | Status: DC | PRN
Start: 2021-08-19 — End: 2021-08-21

## 2021-08-19 MED ORDER — ACETAMINOPHEN 650 MG RE SUPP: 650.0000 mg | Freq: Four times a day (QID) | RECTAL | Status: DC | PRN

## 2021-08-19 MED ORDER — MAGNESIUM OXIDE -MG SUPPLEMENT 400 (240 MG) MG PO TABS
800.0000 mg | ORAL_TABLET | Freq: Once | ORAL | Status: AC
  Administered 2021-08-19: 800 mg via ORAL
  Filled 2021-08-19: qty 2

## 2021-08-19 MED ORDER — ADULT MULTIVITAMIN W/MINERALS CH
1.0000 | ORAL_TABLET | Freq: Every day | ORAL | Status: DC
  Administered 2021-08-19 – 2021-08-21 (×3): 1 via ORAL
  Filled 2021-08-19 (×3): qty 1

## 2021-08-19 MED ORDER — ONDANSETRON HCL 4 MG/2ML IJ SOLN
4.0000 mg | Freq: Once | INTRAMUSCULAR | Status: AC
  Administered 2021-08-19: 4 mg via INTRAVENOUS
  Filled 2021-08-19: qty 2

## 2021-08-19 NOTE — ED Provider Triage Note (Signed)
Emergency Medicine Provider Triage Evaluation Note  Marissa Doyle , a 41 y.o. female  was evaluated in triage.  Pt complains of right lower quadrant abdominal pains been ongoing for last couple days.  She reports associated nausea, vomiting, diarrhea.  Patient was seen evaluated urgent care and sent here for further evaluation.  Patient also states that she accidentally rubbed her eyes a couple days ago with disinfecting wipes and has been having some tearing since.  Review of Systems  Positive:  Negative: See above   Physical Exam  BP 137/88 (BP Location: Left Arm)   Pulse (!) 101   Temp 99.1 F (37.3 C) (Oral)   Resp 15   LMP 07/30/2021 (Approximate)   SpO2 91%  Gen:   Awake, no distress   Resp:  Normal effort  MSK:   Moves extremities without difficulty  Other:  Right lower quadrant abdominal tenderness.  Medical Decision Making  Medically screening exam initiated at 11:31 AM.  Appropriate orders placed.  DAVEENA ELMORE was informed that the remainder of the evaluation will be completed by another provider, this initial triage assessment does not replace that evaluation, and the importance of remaining in the ED until their evaluation is complete.     Honor Loh [place], [place] 08/19/21 1205

## 2021-08-19 NOTE — Hospital Course (Addendum)
AM Interview 08/21/21: Pain seems to be improving. No nausea of vomiting. She has had a bowel movement. Her stools have been loose, reports this isn't a change from baseline. She feels like she has some more energy. She feels well enough to go home today.    08/20/21-Improved in abd pain with pain regimen. Persistent diarrhea.  Distant cousin has Crohn's disease Denies hx of hematochezia or dark stools

## 2021-08-19 NOTE — Discharge Instructions (Addendum)
D/C to ED via POV 

## 2021-08-19 NOTE — ED Notes (Signed)
The patient's IV line is occluded and new order for IV team to come back to redo IV was placed; Mag will be given once line has been replaced-Monique,RN
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
## 2021-08-19 NOTE — ED Notes (Signed)
Patient is being discharged from the Urgent Care and sent to the Emergency Department via POV with family. Per [employer], PA, patient is in need of higher level of care due to abdominal pain. Patient is aware and verbalizes understanding of plan of care.  Vitals:   08/19/21 1001  BP: 127/81  Pulse: (!) 102  Resp: 20  Temp: 98.9 F (37.2 C)  SpO2: 97%

## 2021-08-19 NOTE — ED Provider Notes (Addendum)
[facility]   CSN: [id number] Arrival date & time: 08/19/21  1044     History  Chief Complaint  Patient presents with   Eye Pain   Abdominal Pain    41 year old female with a past medical history of hypertension, hypothyroidism with known thyroid eye disease not currently on any medications presents to the ED with 4 days of intermittent right lower quadrant abdominal pain with associated nausea and nonbloody vomiting and nonbloody diarrhea.  Patient describes symptoms as intermittent, cramping in nature, and nonradiating.  She states that she has been recently exposed to children in the last several weeks with similar symptoms.  She denies similar symptoms in the past.  Denies associated fevers or urinary symptoms.  She initially presented to urgent care earlier today and was advised to present to the emergency room due to severe, 10/10 pain.  Patient also reports pain just below her right eye after she used a sanitizer wipe several days ago.  She denies any new vision changes, eye pain, or redness.  She states that her pain is primarily just below her right eye and along her right cheek. She does admit to daily alcohol use, typically 3-4 cocktails per day for the last several years. She reports her last drink being 2 weeks ago. Denies illicit or IV drug use.  The history is provided by the patient and medical records.       Home Medications Prior to Admission medications   Medication Sig Start Date End Date Taking? Authorizing Provider  Emollient ([employer] J BURN CREAM EX) Apply 1 packet topically 2 (two) times daily as needed (Burn relief).   Yes [provider]  Pedialyte (PEDIALYTE) SOLN Take 240 mLs by mouth every 4 (four) hours.   Yes [provider]  medroxyPROGESTERone (DEPO-PROVERA) 150 MG/ML injection Inject 150 mg into the muscle every 3 (three) months.    12/09/10  [provider]      Allergies     Patient has no known allergies.    Review of Systems   Review of Systems  Constitutional:  Negative for fever.  Eyes:  Positive for discharge (patient reports chronic). Negative for visual disturbance.  Respiratory:  Negative for cough and shortness of breath.   Cardiovascular:  Negative for chest pain.  Gastrointestinal:  Positive for abdominal pain, diarrhea, nausea and vomiting. Negative for blood in stool.    Physical Exam Updated Vital Signs BP (!) 130/99 (BP Location: Left Arm)   Pulse 100   Temp 98 F (36.7 C) (Oral)   Resp 18   LMP 07/30/2021 (Approximate)   SpO2 97%  Physical Exam Vitals and nursing note reviewed.  Constitutional:      General: She is not in acute distress.    Appearance: She is well-developed.  HENT:     Head: Normocephalic and atraumatic.  Eyes:     General: Scleral icterus present.     Conjunctiva/sclera: Conjunctivae normal.     Comments: Green, mucous discharge present from the bilateral eyes which patient states is baseline. Bilateral proptosis noted with obvious scleral icterus. Mild erythema and dermatitis present to the right maxillary region.  Cardiovascular:     Rate and Rhythm: Regular rhythm.     Heart sounds: No murmur heard. Pulmonary:     Effort: Pulmonary effort is normal. No respiratory distress.     Breath sounds: Normal breath sounds.  Abdominal:     General: There is distension.  Palpations: Abdomen is soft.     Tenderness: There is abdominal tenderness in the right lower quadrant. There is no guarding.     Comments: Specifically, no RUQ tenderness to palpation  Musculoskeletal:        General: No swelling.     Cervical back: Neck supple.  Skin:    General: Skin is warm and dry.  Neurological:     Mental Status: She is alert.  Psychiatric:        Mood and Affect: Mood normal.     ED Results / Procedures / Treatments   Labs (all labs ordered are listed, but only abnormal results are displayed) Labs Reviewed   CBC WITH DIFFERENTIAL/PLATELET - Abnormal; Notable for the following components:      Result Value   RBC 2.94 (*)    Hemoglobin 9.7 (*)    HCT 29.5 (*)    MCV 100.3 (*)    RDW 22.1 (*)    nRBC 0.8 (*)    Abs Immature Granulocytes 0.11 (*)    All other components within normal limits  COMPREHENSIVE METABOLIC PANEL - Abnormal; Notable for the following components:   Potassium 3.0 (*)    Chloride 90 (*)    Glucose, Bld 115 (*)    Creatinine, Ser 1.04 (*)    Calcium 8.8 (*)    AST 76 (*)    Alkaline Phosphatase 228 (*)    Total Bilirubin 5.5 (*)    Anion gap 17 (*)    All other components within normal limits  URINALYSIS, ROUTINE W REFLEX MICROSCOPIC - Abnormal; Notable for the following components:   Color, Urine AMBER (*)    Specific Gravity, Urine >1.046 (*)    Protein, ur 30 (*)    Nitrite POSITIVE (*)    Bacteria, UA MANY (*)    All other components within normal limits  LIPASE, BLOOD - Abnormal; Notable for the following components:   Lipase 76 (*)    All other components within normal limits  MAGNESIUM - Abnormal; Notable for the following components:   Magnesium 1.4 (*)    All other components within normal limits  BILIRUBIN, DIRECT - Abnormal; Notable for the following components:   Bilirubin, Direct 2.9 (*)    All other components within normal limits  HCG, QUANTITATIVE, PREGNANCY  HIV ANTIBODY (ROUTINE TESTING W REFLEX)  COMPREHENSIVE METABOLIC PANEL  CBC    EKG None  Radiology CT ABDOMEN PELVIS W CONTRAST  Result Date: 08/19/2021 CLINICAL DATA:  Right lower quadrant abdominal pain. Intermittent pain for 2-3 days. Nausea and vomiting. EXAM: CT ABDOMEN AND PELVIS WITH CONTRAST TECHNIQUE: Multidetector CT imaging of the abdomen and pelvis was performed using the standard protocol following bolus administration of intravenous contrast. RADIATION DOSE REDUCTION: This exam was performed according to the departmental dose-optimization program which includes  automated exposure control, adjustment of the mA and/or kV according to patient size and/or use of iterative reconstruction technique. CONTRAST:  OMNIPAQUE IOHEXOL 300 MG/ML  SOLN COMPARISON:  None Available. FINDINGS: Lower chest: The lung bases are clear without focal nodule, mass, or airspace disease. Heart size is normal. No significant pleural or pericardial effusion is present. Hepatobiliary: Diffuse fatty infiltration liver is present. No discrete hepatic lesion is present. A single radiopaque gallstone is present. No inflammatory changes are present to suggest cholecystitis. The common bile duct is within normal limits. Pancreas: Unremarkable. No pancreatic ductal dilatation or surrounding inflammatory changes. Spleen: Normal in size without focal abnormality. Adrenals/Urinary Tract: Adrenal glands  are normal bilaterally. Kidneys are unremarkable. No stone or mass lesion is present. No obstruction is present. The ureters are within normal limits bilaterally. The urinary bladder is within normal limits. Stomach/Bowel: The stomach and duodenum are within normal limits. Proximal small bowel is normal. Marked wall thickening and adjacent inflammatory change noted in the terminal ileum and proximal ascending colon. The appendix is visualized and normal. The transverse colon there is within normal limits. Descending colon is normal. Sigmoid colon is mostly collapsed. Vascular/Lymphatic: Calcified and noncalcified atherosclerotic changes are present in the distal aorta without aneurysm or focal stenosis. No significant adenopathy is present. Reproductive: Uterus and bilateral adnexa are unremarkable. IUD is in place. The IUD is positioned in the lower uterine segment and cervix. Other: Free fluid is noted at and inferior to the inflamed terminal ileum. No peripherally enhancing fluid collections are present. No free fluid is present. Musculoskeletal: No acute or significant osseous findings. IMPRESSION: 1.  Marked wall thickening and adjacent inflammatory change in the terminal ileum and proximal ascending colon compatible with ileitis/colitis. Question Crohn's disease. No complicating features are present. 2. Free fluid at and inferior to the inflamed terminal ileum is likely reactive. 3. Hepatic steatosis. 4. Cholelithiasis without evidence for cholecystitis. 5. IUD in place. The IUD is positioned in the lower uterine segment and cervix. Recommend GYN follow-up. 6. Aortic Atherosclerosis (ICD10-I70.0). Electronically Signed   By: Marin Roberts M.D.   On: 08/19/2021 13:48    Procedures Procedures    Medications Ordered in ED Medications  acetaminophen (TYLENOL) tablet 650 mg (has no administration in time range)    Or  acetaminophen (TYLENOL) suppository 650 mg (has no administration in time range)  iohexol (OMNIPAQUE) 300 MG/ML solution 100 mL (100 mLs Intravenous Contrast Given 08/19/21 1332)  lactated ringers bolus 1,000 mL (1,000 mLs Intravenous New Bag/Given 08/19/21 1620)  potassium chloride SA (KLOR-CON M) CR tablet 40 mEq (40 mEq Oral Given 08/19/21 1621)  ketorolac (TORADOL) 15 MG/ML injection 15 mg (15 mg Intravenous Given 08/19/21 1621)  folic acid (FOLVITE) tablet 1 mg (1 mg Oral Given 08/19/21 1622)  thiamine tablet 100 mg (100 mg Oral Given 08/19/21 1622)  ondansetron (ZOFRAN) injection 4 mg (4 mg Intravenous Given 08/19/21 1806)  magnesium oxide (MAG-OX) tablet 800 mg (800 mg Oral Given 08/19/21 1806)  morphine (PF) 4 MG/ML injection 4 mg (4 mg Intravenous Given 08/19/21 1807)    ED Course/ Medical Decision Making/ A&P                           Medical Decision Making Amount and/or Complexity of Data Reviewed Labs: ordered. Radiology: ordered.  Risk OTC drugs. Prescription drug management. Decision regarding hospitalization.   41 year old female with a history as above presents today for 4 days of worsening right lower quadrant abdominal pain with associated nonbloody  vomiting and diarrhea.  On arrival, patient is mildly tachycardic but normotensive, afebrile in the ED.  Her abdomen is soft with focal right lower quadrant tenderness to palpation without guarding.  Emergent differential includes appendicitis, colitis, acute cystitis, pyelonephritis, viral gastroenteritis.  Regarding her reported sanitizer exposure, she appears to have a mild contact dermatitis present to her right cheek.  She denies eye pain, new eye discharge or new visual disturbances.  She does not believe that she she got any sanitizer in her eye.  I attempted to review the patient's chart which did not reveal any recent medical visits.  Patient admits that  she has not seen a doctor in over 4 years and does not take any medications.  Lab work obtained in first look was reviewed which I interpreted.  CBC without a significant leukocytosis, though she does have left shift with elevated immature granulocytes which is nonspecific.  She has evidence of a macrocytic anemia with a hemoglobin of 9.7, though unknown baseline as last hemoglobin was 13.3 in 2015.  CMP with multiple electrolyte abnormalities, including hypokalemia and low magnesium which were orally repleted.  She additionally has evidence of a mild transaminitis and elevated alkaline phosphatase.  She also has a new hyperbilirubinemia at 5.5, direct component of 2.9. Questionable hemolytic vs obstructive component. Of note, she has no right upper quadrant tenderness on exam. UA from urgent care appears questionably infected, though she has no urinary symptoms to support this at this time.  CT abdomen pelvis was obtained which I reviewed.  CT concerning for terminal ileitis with surrounding reactive fluid which could be concerning for new onset Crohn's disease. No definitive abscess or evidence of appendicitis.  Liver with evidence of hepatic steatosis and cholelithiasis without radiographic evidence of cholecystitis. Patient denies a history of this  and states that she does not have any close relatives with this diagnosis.  She states that she may have heard of individuals on her father side having Crohn's disease but is unsure.   Given her multiple electrolyte abnormalities and hyperbilirubinemia with concern for new onset IBD, I spoke with GI for consideration of inpatient work-up, especially in the setting of poor outpatient follow-up.  GI recommended obtaining a right upper quadrant ultrasound, will consider possible MRCP as an inpatient.  I spoke with the inpatient hospitalist who is agreeable to accept the patient to their service.  Plan of care was discussed with the patient and family member at bedside who verbalized understanding.  At this time, patient is stable for transfer to the floor.  Final Clinical Impression(s) / ED Diagnoses Final diagnoses:  Hyperbilirubinemia  Terminal ileitis with complication (HCC)  Hypokalemia  Hypomagnesemia    Rx / DC Orders ED Discharge Orders     None         Shaquill Iseman, [place], MD 08/19/21 9147    Boden Stucky, [place], MD 08/19/21 8295    Pricilla Loveless, MD 08/19/21 2321

## 2021-08-19 NOTE — ED Provider Notes (Signed)
[facility]    CSN: [id number] Arrival date & time: 08/19/21  0932      History   Chief Complaint Chief Complaint  Patient presents with   Abdominal Pain    And burn - Entered by patient   appt [id number]   Skin Problem    HPI LETRICE POLLOK is a 41 y.o. female.  Presents with 4-day history of abdominal pain.  Associated nausea, vomiting, diarrhea.  Pain feels stabbing.  In clinic reports the pain as "contractions".  Denies fever, chills, chest pain, shortness of breath, urinary symptoms/hematuria, back pain or flank pain, rash. No bleeding or spotting. Has IUD. Patient is in wheelchair due to abdominal pain. History of ectopic pregnancy, ovarian cysts, gonorrhea/chlamydia/trichomonas.  Past Medical History:  Diagnosis Date   Abnormal Pap smear    Anemia    Chlamydia    Ectopic pregnancy    Received MTX   Gonorrhea    Hypertension    Hypothyroid    due to hx radiation   Ovarian cyst    Trichomonas     Patient Active Problem List   Diagnosis Date Noted   Postpartum hypertension 11/08/2010   Gonorrhea    Chlamydia    Trichomonas     History reviewed. No pertinent surgical history.  OB History     Gravida  5   Para  4   Term  4   Preterm  0   AB  1   Living  4      SAB  0   IAB  0   Ectopic  1   Multiple  0   Live Births  1            Home Medications    Prior to Admission medications   Medication Sig Start Date End Date Taking? Authorizing Provider  amLODipine (NORVASC) 10 MG tablet Take 10 mg by mouth daily.    [provider]  amLODipine (NORVASC) 10 MG tablet Take 1 tablet (10 mg total) by mouth daily. 07/12/13   Linna Hoff, MD  hydrochlorothiazide (HYDRODIURIL) 25 MG tablet Take 25 mg by mouth daily.    [provider]  levothyroxine (SYNTHROID, LEVOTHROID) 175 MCG tablet Take 175 mcg by mouth daily.      [provider]  oxyCODONE-acetaminophen (PERCOCET/ROXICET) 5-325 MG per tablet 1 to 2  tabs PO q6hrs  PRN for pain 02/17/12   Pisciotta, Joni Reining, PA-C  medroxyPROGESTERone (DEPO-PROVERA) 150 MG/ML injection Inject 150 mg into the muscle every 3 (three) months.    12/09/10  [provider]    Family History Family History  Problem Relation Age of Onset   Hypertension Mother    Hypertension Sister     Social History Social History   Tobacco Use   Smoking status: Every Day    Packs/day: 0.25    Years: 10.00    Total pack years: 2.50    Types: Cigarettes   Smokeless tobacco: Never  Substance Use Topics   Alcohol use: Yes   Drug use: No     Allergies   Patient has no known allergies.   Review of Systems Review of Systems  Gastrointestinal:  Positive for abdominal pain.   Per HPI  Physical Exam Triage Vital Signs ED Triage Vitals  Enc Vitals Group     BP 08/19/21 1001 127/81     Pulse Rate 08/19/21 1001 (!) 102     Resp 08/19/21 1001 20     Temp  08/19/21 1001 98.9 F (37.2 C)     Temp Source 08/19/21 1001 Oral     SpO2 08/19/21 1001 97 %     Weight --      Height --      Head Circumference --      Peak Flow --      Pain Score 08/19/21 1002 10     Pain Loc --      Pain Edu? --      Excl. in GC? --    No data found.  Updated Vital Signs BP 127/81 (BP Location: Left Arm)   Pulse (!) 102   Temp 98.9 F (37.2 C) (Oral)   Resp 20   SpO2 97%    Physical Exam Vitals and nursing note reviewed.  Constitutional:      General: She is in acute distress.     Comments: Bent over in wheelchair, holding abdomen. Appears fatigued, appears older than her stated age  Cardiovascular:     Rate and Rhythm: Regular rhythm. Tachycardia present.  Abdominal:     General: Bowel sounds are normal.     Tenderness: There is abdominal tenderness.     Comments: 10/10 pain with light palpation  Skin:    Findings: Rash present.     Comments: Face with dark, dry rash  Neurological:     Mental Status: She is alert and oriented to person, place, and time.      UC Treatments / Results  Labs (all labs ordered are listed, but only abnormal results are displayed) Labs Reviewed  POCT URINALYSIS DIPSTICK, ED / UC - Abnormal; Notable for the following components:      Result Value   Glucose, UA 100 (*)    Bilirubin Urine LARGE (*)    Ketones, ur 15 (*)    Protein, ur >=300 (*)    Nitrite POSITIVE (*)    Leukocytes,Ua LARGE (*)    All other components within normal limits  POC URINE PREG, ED  POCT URINALYSIS DIPSTICK, ED / UC  POC URINE PREG, ED    EKG  Radiology No results found.  Procedures Procedures (including critical care time)  Medications Ordered in UC Medications - No data to display  Initial Impression / Assessment and Plan / UC Course  I have reviewed the triage vital signs and the nursing notes.  Pertinent labs & imaging results that were available during my care of the patient were reviewed by me and considered in my medical decision making (see chart for details).  Patient with severe abdominal pain with palpation.  I pressed very lightly on the abdomen and patient reported 10 out of 10 pain.  She is bent over in clinic holding her stomach.  Urine pregnancy and urinalysis obtained, pregnancy was negative but urinalysis concerning for infection.  Due to patient severe pain I do recommend that she go to the emergency department for further evaluation and treatment.  She declines medical transport.  She will go by personal vehicle.  Discharged to ED in stable condition considering the resources and capabilities of this urgent care.  Final Clinical Impressions(s) / UC Diagnoses   Final diagnoses:  Generalized abdominal pain     Discharge Instructions      D/C to ED via POV     ED Prescriptions   None    PDMP not reviewed this encounter.   [place] Pelto, Ray Church 08/19/21 1111

## 2021-08-19 NOTE — ED Triage Notes (Signed)
Pt reports having burning ans skin irritation on face for around 3 days. Tried ointment but didn't help.   Pt also c/o abd pains that feels like setting stabbed with needles for 3-4 days. Reports at first did have n/v/d. Been drinking Pedialyte. Has no appetite and is fatigued.

## 2021-08-19 NOTE — ED Triage Notes (Signed)
Pt arrives with multiple complaints, seen at [facility] for same and was sent here. Accidentally put hand sanitizer wipes on her R eye 4-5 days ago and has continued burning and blurry vision. Also reports intermittent RLQ abdominal pain x 2-3 days with n/v/d. Trying to drink Pedialyte with some success.

## 2021-08-20 DIAGNOSIS — R933 Abnormal findings on diagnostic imaging of other parts of digestive tract: Secondary | ICD-10-CM

## 2021-08-20 DIAGNOSIS — Z793 Long term (current) use of hormonal contraceptives: Secondary | ICD-10-CM | POA: Diagnosis not present

## 2021-08-20 DIAGNOSIS — Z8249 Family history of ischemic heart disease and other diseases of the circulatory system: Secondary | ICD-10-CM | POA: Diagnosis not present

## 2021-08-20 DIAGNOSIS — E669 Obesity, unspecified: Secondary | ICD-10-CM | POA: Diagnosis present

## 2021-08-20 DIAGNOSIS — K529 Noninfective gastroenteritis and colitis, unspecified: Secondary | ICD-10-CM | POA: Diagnosis present

## 2021-08-20 DIAGNOSIS — T2050XA Corrosion of first degree of head, face, and neck, unspecified site, initial encounter: Secondary | ICD-10-CM | POA: Diagnosis present

## 2021-08-20 DIAGNOSIS — Z923 Personal history of irradiation: Secondary | ICD-10-CM | POA: Diagnosis not present

## 2021-08-20 DIAGNOSIS — R17 Unspecified jaundice: Secondary | ICD-10-CM

## 2021-08-20 DIAGNOSIS — R109 Unspecified abdominal pain: Secondary | ICD-10-CM | POA: Diagnosis present

## 2021-08-20 DIAGNOSIS — F17210 Nicotine dependence, cigarettes, uncomplicated: Secondary | ICD-10-CM | POA: Diagnosis present

## 2021-08-20 DIAGNOSIS — Z6838 Body mass index (BMI) 38.0-38.9, adult: Secondary | ICD-10-CM | POA: Diagnosis not present

## 2021-08-20 DIAGNOSIS — H1089 Other conjunctivitis: Secondary | ICD-10-CM | POA: Diagnosis present

## 2021-08-20 DIAGNOSIS — E46 Unspecified protein-calorie malnutrition: Secondary | ICD-10-CM | POA: Diagnosis present

## 2021-08-20 DIAGNOSIS — E876 Hypokalemia: Secondary | ICD-10-CM | POA: Diagnosis present

## 2021-08-20 DIAGNOSIS — A09 Infectious gastroenteritis and colitis, unspecified: Secondary | ICD-10-CM | POA: Diagnosis present

## 2021-08-20 DIAGNOSIS — K8070 Calculus of gallbladder and bile duct without cholecystitis without obstruction: Secondary | ICD-10-CM | POA: Diagnosis present

## 2021-08-20 DIAGNOSIS — E039 Hypothyroidism, unspecified: Secondary | ICD-10-CM | POA: Diagnosis present

## 2021-08-20 DIAGNOSIS — D539 Nutritional anemia, unspecified: Secondary | ICD-10-CM | POA: Diagnosis present

## 2021-08-20 DIAGNOSIS — F1090 Alcohol use, unspecified, uncomplicated: Secondary | ICD-10-CM | POA: Diagnosis present

## 2021-08-20 DIAGNOSIS — T6591XA Toxic effect of unspecified substance, accidental (unintentional), initial encounter: Secondary | ICD-10-CM | POA: Diagnosis present

## 2021-08-20 DIAGNOSIS — R42 Dizziness and giddiness: Secondary | ICD-10-CM | POA: Diagnosis present

## 2021-08-20 DIAGNOSIS — K760 Fatty (change of) liver, not elsewhere classified: Secondary | ICD-10-CM

## 2021-08-20 DIAGNOSIS — I10 Essential (primary) hypertension: Secondary | ICD-10-CM | POA: Diagnosis present

## 2021-08-20 DIAGNOSIS — R7401 Elevation of levels of liver transaminase levels: Secondary | ICD-10-CM | POA: Diagnosis present

## 2021-08-20 LAB — HEPATITIS B SURFACE ANTIBODY,QUALITATIVE: Hep B S Ab: NONREACTIVE

## 2021-08-20 LAB — COMPREHENSIVE METABOLIC PANEL
ALT: 34 U/L (ref 0–44)
AST: 70 U/L — ABNORMAL HIGH (ref 15–41)
Albumin: 3.7 g/dL (ref 3.5–5.0)
Alkaline Phosphatase: 213 U/L — ABNORMAL HIGH (ref 38–126)
Anion gap: 11 (ref 5–15)
BUN: 13 mg/dL (ref 6–20)
CO2: 32 mmol/L (ref 22–32)
Calcium: 8.2 mg/dL — ABNORMAL LOW (ref 8.9–10.3)
Chloride: 88 mmol/L — ABNORMAL LOW (ref 98–111)
Creatinine, Ser: 1.1 mg/dL — ABNORMAL HIGH (ref 0.44–1.00)
GFR, Estimated: >60 mL/min (ref >60)
Glucose, Bld: 117 mg/dL — ABNORMAL HIGH (ref 70–99)
Potassium: 2.8 mmol/L — ABNORMAL LOW (ref 3.5–5.1)
Sodium: 131 mmol/L — ABNORMAL LOW (ref 135–145)
Total Bilirubin: 4.5 mg/dL — ABNORMAL HIGH (ref 0.3–1.2)
Total Protein: 7.4 g/dL (ref 6.5–8.1)

## 2021-08-20 LAB — FERRITIN: Ferritin: 116 ng/mL (ref 11–307)

## 2021-08-20 LAB — CBC
HCT: 27.2 % — ABNORMAL LOW (ref 36.0–46.0)
Hemoglobin: 9 g/dL — ABNORMAL LOW (ref 12.0–15.0)
MCH: 32.8 pg (ref 26.0–34.0)
MCHC: 33.1 g/dL (ref 30.0–36.0)
MCV: 99.3 fL (ref 80.0–100.0)
Platelets: 165 10*3/uL (ref 150–400)
RBC: 2.74 MIL/uL — ABNORMAL LOW (ref 3.87–5.11)
RDW: 22.5 % — ABNORMAL HIGH (ref 11.5–15.5)
WBC: 6.3 10*3/uL (ref 4.0–10.5)
nRBC: 0.3 % — ABNORMAL HIGH (ref 0.0–0.2)

## 2021-08-20 LAB — COMPREHENSIVE METABOLIC PANEL WITH GFR
ALT: 32 U/L (ref 0–44)
AST: 59 U/L — ABNORMAL HIGH (ref 15–41)
Albumin: 3.7 g/dL (ref 3.5–5.0)
Alkaline Phosphatase: 204 U/L — ABNORMAL HIGH (ref 38–126)
Anion gap: 15 (ref 5–15)
BUN: 14 mg/dL (ref 6–20)
CO2: 29 mmol/L (ref 22–32)
Calcium: 8.7 mg/dL — ABNORMAL LOW (ref 8.9–10.3)
Chloride: 90 mmol/L — ABNORMAL LOW (ref 98–111)
Creatinine, Ser: 1.01 mg/dL — ABNORMAL HIGH (ref 0.44–1.00)
GFR, Estimated: >60 mL/min
Glucose, Bld: 95 mg/dL (ref 70–99)
Potassium: 4.1 mmol/L (ref 3.5–5.1)
Sodium: 134 mmol/L — ABNORMAL LOW (ref 135–145)
Total Bilirubin: 3.9 mg/dL — ABNORMAL HIGH (ref 0.3–1.2)
Total Protein: 7.5 g/dL (ref 6.5–8.1)

## 2021-08-20 LAB — HEMOGLOBIN A1C
Hgb A1c MFr Bld: 5.3 % (ref 4.8–5.6)
Mean Plasma Glucose: 105.41 mg/dL

## 2021-08-20 LAB — LIPID PANEL
Cholesterol: 163 mg/dL (ref 0–200)
HDL: 82 mg/dL (ref >40)
LDL Cholesterol: 71 mg/dL (ref 0–99)
Total CHOL/HDL Ratio: 2 RATIO
Triglycerides: 50 mg/dL (ref <150)
VLDL: 10 mg/dL (ref 0–40)

## 2021-08-20 LAB — C-REACTIVE PROTEIN: CRP: 5.2 mg/dL — ABNORMAL HIGH (ref <1.0)

## 2021-08-20 LAB — IRON AND TIBC
Iron: 51 ug/dL (ref 28–170)
Saturation Ratios: 13 % (ref 10.4–31.8)
TIBC: 391 ug/dL (ref 250–450)
UIBC: 340 ug/dL

## 2021-08-20 LAB — RETICULOCYTES
Immature Retic Fract: 35.8 % — ABNORMAL HIGH (ref 2.3–15.9)
RBC.: 2.51 MIL/uL — ABNORMAL LOW (ref 3.87–5.11)
Retic Count, Absolute: 115.5 10*3/uL (ref 19.0–186.0)
Retic Ct Pct: 4.6 % — ABNORMAL HIGH (ref 0.4–3.1)

## 2021-08-20 LAB — VITAMIN B12: Vitamin B-12: 855 pg/mL (ref 180–914)

## 2021-08-20 LAB — BILIRUBIN, FRACTIONATED(TOT/DIR/INDIR)
Bilirubin, Direct: 2.5 mg/dL — ABNORMAL HIGH (ref 0.0–0.2)
Indirect Bilirubin: 2 mg/dL — ABNORMAL HIGH (ref 0.3–0.9)
Total Bilirubin: 4.5 mg/dL — ABNORMAL HIGH (ref 0.3–1.2)

## 2021-08-20 LAB — TSH: TSH: 21.125 u[IU]/mL — ABNORMAL HIGH (ref 0.350–4.500)

## 2021-08-20 LAB — LACTATE DEHYDROGENASE: LDH: 298 U/L — ABNORMAL HIGH (ref 98–192)

## 2021-08-20 LAB — HEPATITIS A ANTIBODY, TOTAL: hep A Total Ab: NONREACTIVE

## 2021-08-20 LAB — HIV ANTIBODY (ROUTINE TESTING W REFLEX): HIV Screen 4th Generation wRfx: NONREACTIVE

## 2021-08-20 LAB — MAGNESIUM: Magnesium: 1.8 mg/dL (ref 1.7–2.4)

## 2021-08-20 LAB — SEDIMENTATION RATE: Sed Rate: 17 mm/hr (ref 0–22)

## 2021-08-20 LAB — HEPATITIS C ANTIBODY: HCV Ab: NONREACTIVE

## 2021-08-20 LAB — HEPATITIS B SURFACE ANTIGEN: Hepatitis B Surface Ag: NONREACTIVE

## 2021-08-20 LAB — T4, FREE: Free T4: <0.25 ng/dL — ABNORMAL LOW (ref 0.61–1.12)

## 2021-08-20 MED ORDER — FAMOTIDINE 20 MG PO TABS
20.0000 mg | ORAL_TABLET | Freq: Two times a day (BID) | ORAL | Status: DC
  Administered 2021-08-20 – 2021-08-21 (×3): 20 mg via ORAL
  Filled 2021-08-20 (×3): qty 1

## 2021-08-20 MED ORDER — ENOXAPARIN SODIUM 40 MG/0.4ML IJ SOSY
40.0000 mg | PREFILLED_SYRINGE | INTRAMUSCULAR | Status: DC
  Administered 2021-08-20: 40 mg via SUBCUTANEOUS
  Filled 2021-08-20: qty 0.4

## 2021-08-20 MED ORDER — POTASSIUM CHLORIDE 20 MEQ PO PACK
40.0000 meq | PACK | ORAL | Status: AC
  Administered 2021-08-20 (×2): 40 meq via ORAL
  Filled 2021-08-20 (×2): qty 2

## 2021-08-20 MED ORDER — LEVOTHYROXINE SODIUM 75 MCG PO TABS
150.0000 ug | ORAL_TABLET | Freq: Every day | ORAL | Status: DC
  Administered 2021-08-21: 150 ug via ORAL
  Filled 2021-08-20: qty 2

## 2021-08-20 MED ORDER — POTASSIUM CHLORIDE CRYS ER 20 MEQ PO TBCR
40.0000 meq | EXTENDED_RELEASE_TABLET | Freq: Once | ORAL | Status: AC
Start: 2021-08-20 — End: 2021-08-20
  Administered 2021-08-20: 40 meq via ORAL
  Filled 2021-08-20: qty 2
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
## 2021-08-20 NOTE — Plan of Care (Signed)

## 2021-08-20 NOTE — TOC Initial Note (Addendum)
Transition of Care [facility]) - Initial/Assessment Note    Patient Details  Name: Marissa Doyle MRN: [id number] Date of Birth: 1981/01/05  [facility] CM/SW Contact:    Tom-Johnson, Hershal Coria, RN Phone Number: 08/20/2021, 12:19 PM  Clinical Narrative:                  CM spoke with patient at bedside about needs for post hospital transition. Admitted for N/V with diarrhea x 4 days. Found to have Hyperbilirubinemia. From home with two out of her four children. Patient states she is not employed and not on disability. States she gets her income from Child Support.  Independent with care and drive self prior to admission. Does not have DME's at home and does not have a PCP. Patient requested to schedule hosp f/u with [facility]. Info on AVS. Requesting also to use TOC pharmacy at discharge. MD to send prescriptions to [facility] pharmacy at discharge. No PT/OT recommendations or needs noted. CM will continue to follow with needs.   13:34- Received a call [phone]) from Jiles Prows, Nurse Care Manager at the [facility]- Primary care by patient's insurance, [facility], requesting an update about discharge plan. CM notified Terri that patient is still having medical workup done and will notify her of discharge date. Terri also notified this CM that patient is active with their clinic and scheduled hospital f/u. Info on AVS. CM cancelled initial hosp f/u scheduled with [facility] at [facility].  CM will continue to follow with care.     Expected Discharge Plan: Home/Self Care Barriers to Discharge: Continued Medical Work up   Patient Goals and CMS Choice Patient states their goals for this hospitalization and ongoing recovery are:: To return home CMS Medicare.gov Compare Post Acute Care list provided to:: Patient Choice offered to / list presented to : NA  Expected Discharge Plan and Services Expected Discharge Plan: Home/Self Care In-house Referral: PCP / Health  [facility] Discharge Planning Services: CM Consult, Follow-up appt scheduled Post Acute Care Choice: NA Living arrangements for the past 2 months: Single Family Home                 DME Arranged: N/A DME Agency: NA       HH Arranged: NA HH Agency: NA        Prior Living Arrangements/Services Living arrangements for the past 2 months: Single Family Home Lives with:: Minor Children Patient language and need for interpreter reviewed:: Yes Do you feel safe going back to the place where you live?: Yes      Need for Family Participation in Patient Care: Yes (Comment) Care giver support system in place?: Yes (comment)   Criminal Activity/Legal Involvement Pertinent to Current Situation/Hospitalization: No - Comment as needed  Activities of Daily Living Home Assistive Devices/Equipment: None ADL Screening (condition at time of admission) Patient's cognitive ability adequate to safely complete daily activities?: Yes Is the patient deaf or have difficulty hearing?: No Does the patient have difficulty seeing, even when wearing glasses/contacts?: No Does the patient have difficulty concentrating, remembering, or making decisions?: No Patient able to express need for assistance with ADLs?: Yes Does the patient have difficulty dressing or bathing?: No Independently performs ADLs?: Yes (appropriate for developmental age) Does the patient have difficulty walking or climbing stairs?: Yes Weakness of Legs: Both Weakness of Arms/Hands: None  Permission Sought/Granted Permission sought to share information with : Case Manager, Family Supports Permission granted to share information with :  Yes, Verbal Permission Granted              Emotional Assessment Appearance:: Appears stated age Attitude/Demeanor/Rapport: Engaged, Gracious Affect (typically observed): Accepting, Appropriate, Calm, Hopeful Orientation: : Oriented to Self, Oriented to Place, Oriented to  Time, Oriented to  Situation Alcohol / Substance Use: Not Applicable Psych Involvement: No (comment)  Admission diagnosis:  Hypokalemia [E87.6] Hypomagnesemia [E83.42] Hyperbilirubinemia [E80.6] Terminal ileitis with complication (HCC) [K50.019] Enterocolitis [K52.9] Patient Active Problem List   Diagnosis Date Noted   Enterocolitis 08/20/2021   Hyperbilirubinemia 08/19/2021   Hypothyroidism 08/19/2021   Bacterial conjunctivitis of both eyes 08/19/2021   Postpartum hypertension 11/08/2010   Gonorrhea    Chlamydia    Trichomonas    PCP:  Patient, No Pcp Per (Inactive) Pharmacy:   [id number] - [place] ([place] - [address] [place] ([place]) [place] [address] Phone: [phone] Fax: [phone]     Social Determinants of Health (SDOH) Interventions    Readmission Risk Interventions     No data to display

## 2021-08-21 LAB — COMPREHENSIVE METABOLIC PANEL
ALT: 34 U/L (ref 0–44)
AST: 78 U/L — ABNORMAL HIGH (ref 15–41)
Albumin: 3.6 g/dL (ref 3.5–5.0)
Alkaline Phosphatase: 202 U/L — ABNORMAL HIGH (ref 38–126)
Anion gap: 12 (ref 5–15)
BUN: 11 mg/dL (ref 6–20)
CO2: 28 mmol/L (ref 22–32)
Calcium: 8.7 mg/dL — ABNORMAL LOW (ref 8.9–10.3)
Chloride: 94 mmol/L — ABNORMAL LOW (ref 98–111)
Creatinine, Ser: 0.98 mg/dL (ref 0.44–1.00)
GFR, Estimated: >60 mL/min (ref >60)
Glucose, Bld: 83 mg/dL (ref 70–99)
Potassium: 4 mmol/L (ref 3.5–5.1)
Sodium: 134 mmol/L — ABNORMAL LOW (ref 135–145)
Total Bilirubin: 3 mg/dL — ABNORMAL HIGH (ref 0.3–1.2)
Total Protein: 7.2 g/dL (ref 6.5–8.1)

## 2021-08-21 LAB — CBC
HCT: 25.1 % — ABNORMAL LOW (ref 36.0–46.0)
Hemoglobin: 8.2 g/dL — ABNORMAL LOW (ref 12.0–15.0)
MCH: 32.5 pg (ref 26.0–34.0)
MCHC: 32.7 g/dL (ref 30.0–36.0)
MCV: 99.6 fL (ref 80.0–100.0)
Platelets: 180 10*3/uL (ref 150–400)
RBC: 2.52 MIL/uL — ABNORMAL LOW (ref 3.87–5.11)
RDW: 22.5 % — ABNORMAL HIGH (ref 11.5–15.5)
WBC: 6.2 10*3/uL (ref 4.0–10.5)
nRBC: 0.5 % — ABNORMAL HIGH (ref 0.0–0.2)

## 2021-08-21 LAB — FOLATE: Folate: 16.9 ng/mL (ref >5.9)

## 2021-08-21 LAB — T3, FREE: T3, Free: <0.3 pg/mL — ABNORMAL LOW (ref 2.0–4.4)

## 2021-08-21 LAB — PROTIME-INR
INR: 1.2 (ref 0.8–1.2)
Prothrombin Time: 14.9 seconds (ref 11.4–15.2)

## 2021-08-21 LAB — T3: T3, Total: <20 ng/dL — ABNORMAL LOW (ref 71–180)

## 2021-08-21 MED ORDER — LEVOTHYROXINE SODIUM 150 MCG PO TABS: 150.0000 ug | ORAL_TABLET | Freq: Every day | ORAL | 0 refills | Status: DC

## 2021-08-21 MED ORDER — CIPROFLOXACIN HCL 0.3 % OP SOLN
2.0000 [drp] | OPHTHALMIC | 0 refills | Status: DC
Start: 2021-08-21 — End: 2021-11-20

## 2021-08-21 NOTE — Discharge Summary (Addendum)
Name: Marissa Doyle MRN: [id number] DOB: 1980-08-07 41 y.o. PCP: Patient, No Pcp Per  Date of Admission: 08/19/2021 11:15 AM Date of Discharge: 08/21/21 Attending Physician: Dr. Antony ContrasGuilloud  Discharge Diagnosis: 1. Ileitis/colitis 2. Macrocytic anemia 3. Hyperbilirubinemia 4. Severe hepatic steatosis 5. Bacterial conjunctivitis 6. Hypokalemia 7. Hypomagnesemia 8. Hypothyroidism   Discharge Medications: Allergies as of 08/21/2021   No Known Allergies      Medication List     TAKE these medications    ciprofloxacin 0.3 % ophthalmic solution Commonly known as: CILOXAN Place 2 drops into both eyes every 4 (four) hours while awake. Administer 1 drop, every 2 hours, while awake, for 2 days. Then 1 drop, every 4 hours, while awake, for the next 5 days.   J & J BURN CREAM EX Apply 1 packet topically 2 (two) times daily as needed (Burn relief).   levothyroxine 150 MCG tablet Commonly known as: SYNTHROID Take 1 tablet (150 mcg total) by mouth daily at 6 (six) AM. Start taking on: August 22, 2021   Pedialyte Soln Take 240 mLs by mouth every 4 (four) hours.        Disposition and follow-up:   Marissa Doyle was [facility] in Stable condition.  At the hospital follow up visit please address:  1.    Ileitis/colitis Hyperbilirubinemia -Reassess abdominal pain and GI symptoms -Patient will need follow-up with GI for possible colonoscopy -Repeat CMP  Hypothyroidism -Levothyroxine 150 mcg was started.  -Repeat TSH in 4 to 6 weeks for response  Macrocytic anemia -Repeat CBC -Assess nutritional status  Hepatic steatosis -Follow-up autoimmune blood work -Encourage alcohol cessation  Bacterial conjunctivitis -Assess for resolution of symptoms -Patient will continue ciprofloxacin eyedrop for 3 days after discharge to complete a 5 days course.  2.  Labs / imaging needed at time of follow-up: CBC, CMP, TSH  3.  Pending labs/ test  needing follow-up: ANA, mitochondrial antibodies, anti-smooth muscle antibodies, Alpha-1-antitrypsin, Ceruloplasmin   Follow-up Appointments:  Follow-up Information     [facility] [place]. Go in 10 day(s).   Why: Appointment is on 08/30/21 at 1:30 pm. Contact information: [address]  [place], [place] [address]  [phone]                Hospital Course by problem list: Ileitis/colitis Patient presented with abdominal pain, nausea, vomiting and diarrhea for 4 days prior to admission.  Pain located in the right side and and were described as cramping.  Diarrhea is non-bloody.  Patient denies melena.  Emesis is also nonbloody. Abdominal CT showed wall thickening and inflammation in the terminal ileum and proximal ascending colon concerning for ileitis/colitis, suspicious for Crohn's disease.  She has no systemic symptoms including fever or tachycardia or leukocytosis so no antibiotic was started.  Suspect this is infectious colitis.  GI has evaluated the patient, they suspect gastroenteritis in the setting of fatty liver disease. They recommend following up serologies for rule out of autoimmune disease then discharge with follow-up for outpatient colonoscopy. Hepatitis panel negative. Folate, PT/INR within normal limits. ANA, mitochondrial antibodies, anti-smooth muscle antibodies, Alpha-1-antitrypsin, Ceruloplasmin pending. At the time of discharge, the patient's abdominal pain and nausea have resolved. Plan for outpatient follow-up with GI for consideration of colonoscopy.  Severe hepatic steatosis Hyperbilirubinemia Patient presented with significant elevation of total bilirubin to 5.5 with elevation of both direct and indirect bilirubin. The patient's RUQ ultrasound showed cholelithiasis without evidence of cholecystitis. CT scan showed hepatic steatosis in  addition to colitis described above. Patient may have passed a CBD stone prior to imaging, or elevation could be  secondary to hepatic steatosis possibly caused by alcohol use versus autoimmune hepatitis. GI will see her as an outpatient for further workup of this. Lipid panel and A1C were within normal limits.   Macrocytic anemia She has had no hematochezia, melena or signs of active bleeding during admission. Iron studies, B12, ferritin within normal limits.  Hemolytic anemia was ruled out.  Suspect her anemia secondary to malnutrition/alcohol use causing bone marrow suppression.  Bacterial conjunctivitis Patient presented with conjunctival injection after she reported that her child gave her a sanitizer wipe instead of a baby wipe that caused rash and eye discharge.  She denies any change in her vision.  Patient was started on ciprofloxacin eye drops with reported improvement of her ocular symptoms. Patient will be discharged with eye drops for 3 more days and can follow up with ophthalmology if symptoms do not improve.   Hypothyroidism Patient with history of hypothyroidism in the past. She states that she was previously on medication for this but recently has not been taking it. TSH here was 21 with undetectable free T4 less than 0.25 and undetectable free T3 less than 0.3. Patient was started on levothyroxine 150 mcg during this admission and will follow up at the internal medicine clinic for management of hypothyroidism.  HPI Patient was seen at bedside.  She appears comfortable in no acute distress.  She report improvement of her abdominal pain without needing pain medication.  She tolerated p.o. fine without nausea or vomiting.  Discharge Exam:   BP 119/78 (BP Location: Right Arm)   Pulse 86   Temp 98 F (36.7 C) (Oral)   Resp 20   Ht 5\' 3"  (1.6 m)   Wt 97.9 kg   LMP 07/30/2021 (Approximate)   SpO2 97%   BMI 38.23 kg/m  Discharge exam:  Constitutional: Appears well-developed and well-nourished. No distress.  HENT: Normocephalic and atraumatic, EOMI, conjunctiva normal, moist mucous  membranes Cardiovascular: Normal rate, regular rhythm, S1 and S2 present, no murmurs, rubs, gallops.  Distal pulses intact. Respiratory: Effort is normal on room air.  Lungs are clear to auscultation bilaterally. GI: Soft. Non-distended. Non-tender. No appreciable organomegaly.  Musculoskeletal: Normal bulk and tone.  No peripheral edema noted. Neurological: Alert and oriented x4, no apparent focal deficits noted. Skin: Warm and dry.  No rash, erythema, lesions noted. Psychiatric: Normal mood and affect. Behavior is normal.   Pertinent Labs, Studies, and Procedures:   08/01/2021 Abdomen Limited RUQ (LIVER/GB)  Result Date: 08/19/2021 CLINICAL DATA:  Hyperbilirubinemia. EXAM: ULTRASOUND ABDOMEN LIMITED RIGHT UPPER QUADRANT COMPARISON:  CT evaluation of August 19, 2021. FINDINGS: Gallbladder: Single shadowing gallstone measuring 0.7 cm. No reported tenderness over the gallbladder. No increased wall thickness. Common bile duct: Diameter: 5.2 mm Liver: Severe hepatic steatosis markedly limits further assessment. No focal lesion to the extent that can be evaluated currently based on patient body habitus and severe hepatic steatosis. Portal vein is patent on color Doppler imaging with normal direction of blood flow towards the liver. Other: Small volume ascites adjacent to the liver margin. IMPRESSION: Severe hepatic steatosis. Exam limited by degree of steatosis and patient body habitus. Cholelithiasis without acute biliary process. Small volume ascites in the setting of enterocolitis. Electronically Signed   By: August 21, 2021 M.D.   On: 08/19/2021 18:58   CT ABDOMEN PELVIS W CONTRAST  Result Date: 08/19/2021 CLINICAL DATA:  Right lower quadrant  abdominal pain. Intermittent pain for 2-3 days. Nausea and vomiting. EXAM: CT ABDOMEN AND PELVIS WITH CONTRAST TECHNIQUE: Multidetector CT imaging of the abdomen and pelvis was performed using the standard protocol following bolus administration of intravenous contrast.  RADIATION DOSE REDUCTION: This exam was performed according to the departmental dose-optimization program which includes automated exposure control, adjustment of the mA and/or kV according to patient size and/or use of iterative reconstruction technique. CONTRAST:  OMNIPAQUE IOHEXOL 300 MG/ML  SOLN COMPARISON:  None Available. FINDINGS: Lower chest: The lung bases are clear without focal nodule, mass, or airspace disease. Heart size is normal. No significant pleural or pericardial effusion is present. Hepatobiliary: Diffuse fatty infiltration liver is present. No discrete hepatic lesion is present. A single radiopaque gallstone is present. No inflammatory changes are present to suggest cholecystitis. The common bile duct is within normal limits. Pancreas: Unremarkable. No pancreatic ductal dilatation or surrounding inflammatory changes. Spleen: Normal in size without focal abnormality. Adrenals/Urinary Tract: Adrenal glands are normal bilaterally. Kidneys are unremarkable. No stone or mass lesion is present. No obstruction is present. The ureters are within normal limits bilaterally. The urinary bladder is within normal limits. Stomach/Bowel: The stomach and duodenum are within normal limits. Proximal small bowel is normal. Marked wall thickening and adjacent inflammatory change noted in the terminal ileum and proximal ascending colon. The appendix is visualized and normal. The transverse colon there is within normal limits. Descending colon is normal. Sigmoid colon is mostly collapsed. Vascular/Lymphatic: Calcified and noncalcified atherosclerotic changes are present in the distal aorta without aneurysm or focal stenosis. No significant adenopathy is present. Reproductive: Uterus and bilateral adnexa are unremarkable. IUD is in place. The IUD is positioned in the lower uterine segment and cervix. Other: Free fluid is noted at and inferior to the inflamed terminal ileum. No peripherally enhancing fluid  collections are present. No free fluid is present. Musculoskeletal: No acute or significant osseous findings. IMPRESSION: 1. Marked wall thickening and adjacent inflammatory change in the terminal ileum and proximal ascending colon compatible with ileitis/colitis. Question Crohn's disease. No complicating features are present. 2. Free fluid at and inferior to the inflamed terminal ileum is likely reactive. 3. Hepatic steatosis. 4. Cholelithiasis without evidence for cholecystitis. 5. IUD in place. The IUD is positioned in the lower uterine segment and cervix. Recommend GYN follow-up. 6. Aortic Atherosclerosis (ICD10-I70.0). Electronically Signed   By: Marin Roberts M.D.   On: 08/19/2021 13:48       Latest Ref Rng & Units 08/21/2021    2:41 AM 08/20/2021    3:43 AM 08/19/2021   11:40 AM  CBC  WBC 4.0 - 10.5 K/uL 6.2  6.3  7.8   Hemoglobin 12.0 - 15.0 g/dL 8.2  9.0  9.7   Hematocrit 36.0 - 46.0 % 25.1  27.2  29.5   Platelets 150 - 400 K/uL 180  165  182        Latest Ref Rng & Units 08/21/2021    2:41 AM 08/20/2021    6:24 AM 08/19/2021   11:54 PM  BMP  Glucose 70 - 99 mg/dL 83  95  811   BUN 6 - 20 mg/dL Creatinine 0.44 - 1.00 mg/dL 9.14  7.82  9.56   Sodium 135 - 145 mmol/L 134  134  131   Potassium 3.5 - 5.1 mmol/L 4.0  4.1  2.8   Chloride 98 - 111 mmol/L 94  90  88   CO2 22 -  32 mmol/L 28  29  32   Calcium 8.9 - 10.3 mg/dL 8.7  8.7  8.2     Discharge Instructions: Discharge Instructions     Call MD for:  persistant nausea and vomiting   Complete by: As directed    Call MD for:  severe uncontrolled pain   Complete by: As directed    Diet - low sodium heart healthy   Complete by: As directed    Discharge instructions   Complete by: As directed    Ms. Dicarlo,  It was a pleasure taking care of you during this admission.  You were hospitalized for abdominal pain, nausea, vomiting and diarrhea.  We think this is a viral illness that caused some inflammation of your  bowels.  This illness is typically self-limited and will resolve shortly.  Please make sure you drink enough fluid to maintain hydration.  Your thyroid level was very low so we started you back on levothyroxine.  Please take 1 tablet every morning on an empty stomach.  We also prescribed an antibiotic eyedrop to for your eye infection.  We will set up a follow-up appointment with the internal medicine clinic at [facility] in 1-2-week for you.   You will also need to follow-up with a gastroenterologist outpatient for a possible colonoscopy.  Take care,  Dr. Cyndie Chime   Increase activity slowly   Complete by: As directed        You were hospitalized for abdominal pain, nausea, diarrhea. Thank you for allowing [place] to be part of your care.   Please follow up with Internal Medicine Clinic and the Gastroenterologist for further workup of your abdominal pain and hypothyroidism.   Please note these changes made to your medications:   Please START taking: Levothyroxine 150 mcg every day   Please make sure to drink plenty of fluids.  Please call our clinic if you have any questions or concerns, we may be able to help and keep you from a long and expensive emergency room wait. Our clinic and after hours phone number is [phone], the best time to call is Monday through Friday 9 am to 4 pm but there is always someone available 24/7 if you have an emergency. If you need medication refills please notify your pharmacy one week in advance and they will send [place] a request.   Signed: Doran Stabler, DO 08/21/2021, 4:53 PM   Pager: [phone]

## 2021-08-21 NOTE — TOC Transition Note (Signed)
Transition of Care [facility]) - CM/SW Discharge Note   Patient Details  Name: MAANYA HIPPERT MRN: [id number] Date of Birth: May 13, 1980  Transition of [facility]) CM/SW Contact:  Tom-Johnson, Hershal Coria, RN Phone Number: 08/21/2021, 2:45 PM   Clinical Narrative:     Patient is scheduled for discharge today. No PT/OT needs or recommendations noted. Jiles Prows, Nurse Care Manager at the [facility]- notified of patient's discharge. Patient denies any other needs.Family to transport at discharge. No further TOC needs noted.   Final next level of care: Home/Self Care Barriers to Discharge: Barriers Resolved   Patient Goals and CMS Choice Patient states their goals for this hospitalization and ongoing recovery are:: To return home CMS Medicare.gov Compare Post Acute Care list provided to:: Patient Choice offered to / list presented to : NA  Discharge Placement                Patient to be transferred to facility by: Family      Discharge Plan and Services In-house Referral: PCP / [facility] Discharge Planning Services: CM Consult, Follow-up appt scheduled Post Acute Care Choice: NA          DME Arranged: N/A DME Agency: NA       HH Arranged: NA HH Agency: NA        Social Determinants of Health (SDOH) Interventions     Readmission Risk Interventions     No data to display

## 2021-08-22 LAB — MITOCHONDRIAL ANTIBODIES: Mitochondrial M2 Ab, IgG: <20 Units (ref 0.0–20.0)

## 2021-08-22 LAB — ALPHA-1-ANTITRYPSIN: A-1 Antitrypsin, Ser: 128 mg/dL (ref 101–187)

## 2021-08-22 LAB — CERULOPLASMIN: Ceruloplasmin: 24 mg/dL (ref 19.0–39.0)

## 2021-08-22 LAB — ANA W/REFLEX IF POSITIVE: Anti Nuclear Antibody (ANA): NEGATIVE

## 2021-08-22 LAB — IGG: IgG (Immunoglobin G), Serum: 1790 mg/dL — ABNORMAL HIGH (ref 586–1602)

## 2021-08-22 LAB — ANTI-SMOOTH MUSCLE ANTIBODY, IGG: F-Actin IgG: 15 Units (ref 0–19)

## 2021-09-06 ENCOUNTER — Inpatient Hospital Stay: Payer: Medicaid Other | Admitting: Family

## 2021-09-09 ENCOUNTER — Encounter: Payer: Medicaid Other | Admitting: Student

## 2021-09-12 ENCOUNTER — Other Ambulatory Visit: Payer: Self-pay | Admitting: Nurse Practitioner

## 2021-09-12 ENCOUNTER — Other Ambulatory Visit (HOSPITAL_COMMUNITY): Payer: Self-pay | Admitting: Nurse Practitioner

## 2021-09-12 DIAGNOSIS — R600 Localized edema: Secondary | ICD-10-CM

## 2021-09-13 ENCOUNTER — Ambulatory Visit (HOSPITAL_BASED_OUTPATIENT_CLINIC_OR_DEPARTMENT_OTHER)
Admission: RE | Admit: 2021-09-13 | Discharge: 2021-09-13 | Disposition: A | Discharge status: Home / Self Care | Payer: Medicaid Other | Source: Ambulatory Visit | Attending: Nurse Practitioner | Admitting: Nurse Practitioner

## 2021-09-13 DIAGNOSIS — R600 Localized edema: Secondary | ICD-10-CM | POA: Diagnosis present

## 2021-09-23 ENCOUNTER — Encounter: Payer: Self-pay | Admitting: Nurse Practitioner

## 2021-10-02 ENCOUNTER — Other Ambulatory Visit (HOSPITAL_BASED_OUTPATIENT_CLINIC_OR_DEPARTMENT_OTHER): Payer: Self-pay | Admitting: Nurse Practitioner

## 2021-10-02 ENCOUNTER — Other Ambulatory Visit: Payer: Self-pay | Admitting: Nurse Practitioner

## 2021-10-02 DIAGNOSIS — R748 Abnormal levels of other serum enzymes: Secondary | ICD-10-CM

## 2021-10-11 ENCOUNTER — Encounter: Payer: Self-pay | Admitting: *Deleted

## 2021-10-16 ENCOUNTER — Ambulatory Visit (HOSPITAL_BASED_OUTPATIENT_CLINIC_OR_DEPARTMENT_OTHER): Payer: Medicaid Other

## 2021-10-17 ENCOUNTER — Ambulatory Visit (HOSPITAL_BASED_OUTPATIENT_CLINIC_OR_DEPARTMENT_OTHER)
Admission: RE | Admit: 2021-10-17 | Discharge: 2021-10-17 | Disposition: A | Discharge status: Home / Self Care | Payer: Medicaid Other | Source: Ambulatory Visit | Attending: Nurse Practitioner | Admitting: Nurse Practitioner

## 2021-10-17 DIAGNOSIS — R748 Abnormal levels of other serum enzymes: Secondary | ICD-10-CM | POA: Diagnosis not present

## 2021-10-21 ENCOUNTER — Encounter: Payer: Self-pay | Admitting: Nurse Practitioner

## 2021-10-21 ENCOUNTER — Ambulatory Visit: Payer: Medicaid Other | Admitting: Nurse Practitioner

## 2021-11-20 ENCOUNTER — Ambulatory Visit: Payer: Medicaid Other | Admitting: Nurse Practitioner

## 2021-11-20 ENCOUNTER — Other Ambulatory Visit (INDEPENDENT_AMBULATORY_CARE_PROVIDER_SITE_OTHER): Payer: Medicaid Other

## 2021-11-20 ENCOUNTER — Encounter: Payer: Self-pay | Admitting: Nurse Practitioner

## 2021-11-20 DIAGNOSIS — F1090 Alcohol use, unspecified, uncomplicated: Secondary | ICD-10-CM | POA: Diagnosis not present

## 2021-11-20 DIAGNOSIS — K529 Noninfective gastroenteritis and colitis, unspecified: Secondary | ICD-10-CM

## 2021-11-20 DIAGNOSIS — R7989 Other specified abnormal findings of blood chemistry: Secondary | ICD-10-CM | POA: Diagnosis not present

## 2021-11-20 LAB — COMPREHENSIVE METABOLIC PANEL
ALT: 5 U/L (ref 0–35)
AST: 10 U/L (ref 0–37)
Albumin: 3.5 g/dL (ref 3.5–5.2)
Alkaline Phosphatase: 89 U/L (ref 39–117)
BUN: 9 mg/dL (ref 6–23)
CO2: 28 mEq/L (ref 19–32)
Calcium: 9.6 mg/dL (ref 8.4–10.5)
Chloride: 101 mEq/L (ref 96–112)
Creatinine, Ser: 0.6 mg/dL (ref 0.40–1.20)
GFR: 110.13 mL/min (ref >60.00)
Glucose, Bld: 97 mg/dL (ref 70–99)
Potassium: 3.4 mEq/L — ABNORMAL LOW (ref 3.5–5.1)
Sodium: 136 mEq/L (ref 135–145)
Total Bilirubin: 0.4 mg/dL (ref 0.2–1.2)
Total Protein: 7.4 g/dL (ref 6.0–8.3)

## 2021-11-20 LAB — CBC WITH DIFFERENTIAL/PLATELET
Basophils Absolute: 0.1 10*3/uL (ref 0.0–0.1)
Basophils Relative: 0.8 % (ref 0.0–3.0)
Eosinophils Absolute: 0.4 10*3/uL (ref 0.0–0.7)
Eosinophils Relative: 4.3 % (ref 0.0–5.0)
HCT: 30 % — ABNORMAL LOW (ref 36.0–46.0)
Hemoglobin: 9.7 g/dL — ABNORMAL LOW (ref 12.0–15.0)
Lymphocytes Relative: 23.1 % (ref 12.0–46.0)
Lymphs Abs: 1.9 10*3/uL (ref 0.7–4.0)
MCHC: 32.2 g/dL (ref 30.0–36.0)
MCV: 80.5 fl (ref 78.0–100.0)
Monocytes Absolute: 0.6 10*3/uL (ref 0.1–1.0)
Monocytes Relative: 7.1 % (ref 3.0–12.0)
Neutro Abs: 5.3 10*3/uL (ref 1.4–7.7)
Neutrophils Relative %: 64.7 % (ref 43.0–77.0)
Platelets: 346 10*3/uL (ref 150.0–400.0)
RBC: 3.73 Mil/uL — ABNORMAL LOW (ref 3.87–5.11)
RDW: 15.5 % (ref 11.5–15.5)
WBC: 8.2 10*3/uL (ref 4.0–10.5)

## 2021-11-20 NOTE — Patient Instructions (Signed)
If you are age 43 or younger, your body mass index should be between 19-25. Your Body mass index is 32.78 kg/m. If this is out of the aformentioned range listed, please consider follow up with your Primary Care Provider.  ________________________________________________________  The [facility] providers would like to encourage you to use [facility] to communicate with providers for non-urgent requests or questions.  Due to long hold times on the telephone, sending your provider a message by [facility] may be a faster and more efficient way to get a response.  Please allow 48 business hours for a response.  Please remember that this is for non-urgent requests.  _______________________________________________________  Your provider has requested that you go to the basement level for lab work before leaving today. Press "B" on the elevator. The lab is located at the first door on the left as you exit the elevator.  You have been scheduled for a colonoscopy. Please follow written instructions given to you at your visit today.  Please pick up your prep supplies at the pharmacy within the next 1-3 days. If you use inhalers (even only as needed), please bring them with you on the day of your procedure.  Due to recent changes in healthcare laws, you may see the results of your imaging and laboratory studies on MyChart before your provider has had a chance to review them.  We understand that in some cases there may be results that are confusing or concerning to you. Not all laboratory results come back in the same time frame and the provider may be waiting for multiple results in order to interpret others.  Please give [place] 48 hours in order for your provider to thoroughly review all the results before contacting the office for clarification of your results.   Please discontinue all alcohol use.  Thank you for entrusting me with your care and choosing [facility].  Alcide Evener, NP

## 2021-11-20 NOTE — Progress Notes (Signed)
11/20/2021 LACRESHA FUSILIER [id number] 01-23-1981   CHIEF COMPLAINT: Hospital follow up, schedule a colonoscopy   HISTORY OF PRESENT ILLNESS: Akansha Wyche. Joffe is a 41 year old female with a past medical history of hypertension, hypothyroidism, hepatic steatosis, alcohol use disorder, macrocytic anemia and ileitis/colitis per CT 08/2021.   She was admitted to the hospital 6/12 -08/21/2021 with nausea, vomiting, diarrhea, right-sided abdominal pain and elevated LFTs.  WBC count 7.8.  Hemoglobin 9.7.  T. Bili 4.5. Direct bili 2.9. Alk phos 213. AST 70. ALT 34. INR 1.2. RUQ sono showed gallstones, no CBD or biliary ductal dilatation,  hepatic steatosis and a patent portal vein. CTAP showed wall thickening and inflammation in the terminal ileum and proximal ascending colon concerning for ileitis/colitis. She was evaluated by our inpatient GI team and Dr .Carlean Purl thought she likely had gastroenteritis in the setting of fatty liver disease and anemia likely due to alcohol use. Hep B and C serologies were nonreactive. IgG was elevate suspect due to alcohol use and unlikely autoimmune hepatitis with negative/normal ANA, SMA and AMA levels. Ceruloplasmin 24. Iron 51. HIV nonreactive. Alcohol abstinence was advised and she was discharged home with instructions to schedule an outpatient colonoscopy.   She presents today to further discuss scheduling a diagnostic colonoscopy. No further N/V or acute diarrhea. She remains abstinent from alcohol since her hospitalization 08/2021 as noted above. She previously drank vodka daily, 1 pint lasted 2 days x 3 years. She passes a normal formed brown stool daily. Dairy products produce a loose stool. No rectal bleeding or black stools. No lower abdominal pain. No dysphagia, heartburn or upper abdominal pain. Rare NSAID use. She has lost 33 lbs over the past 3 months. Maternal aunt with Crohn's disease.      Latest Ref Rng & Units 08/21/2021    2:41 AM 08/20/2021    3:43 AM  08/19/2021   11:40 AM  CBC  WBC 4.0 - 10.5 K/uL 6.2  6.3  7.8   Hemoglobin 12.0 - 15.0 g/dL 8.2  9.0  9.7   Hematocrit 36.0 - 46.0 % 25.1  27.2  29.5   Platelets 150 - 400 K/uL 180  165  182        Latest Ref Rng & Units 08/21/2021    2:41 AM 08/20/2021    6:24 AM 08/19/2021   11:54 PM  CMP  Glucose 70 - 99 mg/dL 83  95  117   BUN 6 - 20 mg/dL 11  14  13    Creatinine 0.44 - 1.00 mg/dL 0.98  1.01  1.10   Sodium 135 - 145 mmol/L 134  134  131   Potassium 3.5 - 5.1 mmol/L 4.0  4.1  2.8   Chloride 98 - 111 mmol/L 94  90  88   CO2 22 - 32 mmol/L 28  29  32   Calcium 8.9 - 10.3 mg/dL 8.7  8.7  8.2   Total Protein 6.5 - 8.1 g/dL 7.2  7.5  7.4   Total Bilirubin 0.3 - 1.2 mg/dL 3.0  3.9  4.5    4.5   Alkaline Phos 38 - 126 U/L 202  204  213   AST 15 - 41 U/L 78  59  70   ALT 0 - 44 U/L 34  32  34     Labs 6/12 and 08/20/2021: Hep A total antibody nonreactive. Hepatitis B surface antigen nonreactive. Hep C antibody nonreactive. ANA negative, AMA < 20,  SMA 15. IgG 1,790. Ceruloplasmin 24. A1AT 128. Folate 16.9. TSH 21.125. Direct bili 2.5 > indirect. B12 level 855. Iron 51. Ferritin 116. HIV nonreactive.   RUQ sono 10/17/2021: Gallbladder: Cholelithiasis. The gallbladder is otherwise normal in appearance.   Common bile duct: Diameter: 5.3 mm   Liver:  Increased echogenicity in the liver. No liver mass identified. Portal vein is patent on color Doppler imaging with normal direction of blood flow towards the liver.   IMPRESSION: 1. Cholelithiasis.  The gallbladder is otherwise unremarkable. 2. Increased echogenicity is identified in the liver. This is likely due to the hepatic steatosis identified on recent CT imaging from August 19, 2021.  CTAP 08/19/2021: FINDINGS: Lower chest: The lung bases are clear without focal nodule, mass, or airspace disease. Heart size is normal. No significant pleural or pericardial effusion is present.   Hepatobiliary: Diffuse fatty infiltration liver is  present. No discrete hepatic lesion is present. A single radiopaque gallstone is present. No inflammatory changes are present to suggest cholecystitis. The common bile duct is within normal limits.   Pancreas: Unremarkable. No pancreatic ductal dilatation or surrounding inflammatory changes.   Spleen: Normal in size without focal abnormality.   Adrenals/Urinary Tract: Adrenal glands are normal bilaterally. Kidneys are unremarkable. No stone or mass lesion is present. No obstruction is present. The ureters are within normal limits bilaterally. The urinary bladder is within normal limits.   Stomach/Bowel: The stomach and duodenum are within normal limits. Proximal small bowel is normal. Marked wall thickening and adjacent inflammatory change noted in the terminal ileum and proximal ascending colon. The appendix is visualized and normal. The transverse colon there is within normal limits. Descending colon is normal. Sigmoid colon is mostly collapsed.   Vascular/Lymphatic: Calcified and noncalcified atherosclerotic changes are present in the distal aorta without aneurysm or focal stenosis. No significant adenopathy is present.   Reproductive: Uterus and bilateral adnexa are unremarkable. IUD is in place. The IUD is positioned in the lower uterine segment and cervix.   Other: Free fluid is noted at and inferior to the inflamed terminal ileum. No peripherally enhancing fluid collections are present. No free fluid is present.   Musculoskeletal: No acute or significant osseous findings.   IMPRESSION: 1. Marked wall thickening and adjacent inflammatory change in the terminal ileum and proximal ascending colon compatible with ileitis/colitis. Question Crohn's disease. No complicating features are present. 2. Free fluid at and inferior to the inflamed terminal ileum is likely reactive. 3. Hepatic steatosis. 4. Cholelithiasis without evidence for cholecystitis. 5. IUD in place. The  IUD is positioned in the lower uterine segment and cervix. Recommend GYN follow-up. 6. Aortic Atherosclerosis    Past Medical History:  Diagnosis Date   Abnormal Pap smear    Anemia    Chlamydia    Cholelithiasis    Ectopic pregnancy    Received MTX   Gonorrhea    Hepatic steatosis    Hypertension    Hypothyroid    due to hx radiation   Macrocytic anemia    Ovarian cyst    Trichomonas    Past Surgical History: None   Social History: She has 4 daughters. She smokes < 1/2 pack of cigarettes daily. No alcohol x 2 months. See HPI. No drug use.   Family History: No family history of esophageal, gastric or colon cancer. Mother had hypertension.   No Known Allergies    Outpatient Encounter Medications as of 11/20/2021  Medication Sig   ciprofloxacin (CILOXAN) 0.3 % ophthalmic solution  Place 2 drops into both eyes every 4 (four) hours while awake. Administer 1 drop, every 2 hours, while awake, for 2 days. Then 1 drop, every 4 hours, while awake, for the next 5 days.   Emollient (J & J BURN CREAM EX) Apply 1 packet topically 2 (two) times daily as needed (Burn relief).   levothyroxine (SYNTHROID) 150 MCG tablet Take 1 tablet (150 mcg total) by mouth daily at 6 (six) AM.   Pedialyte (PEDIALYTE) SOLN Take 240 mLs by mouth every 4 (four) hours.   [DISCONTINUED] medroxyPROGESTERone (DEPO-PROVERA) 150 MG/ML injection Inject 150 mg into the muscle every 3 (three) months.     No facility-administered encounter medications on file as of 11/20/2021.    REVIEW OF SYSTEMS:  Gen: Denies fever, sweats or chills. No weight loss.  CV: Denies chest pain, palpitations or edema. Resp: Denies cough, shortness of breath of hemoptysis.  GI: See HPI. GU : Denies urinary burning, blood in urine, increased urinary frequency or incontinence. MS: + Arthritis.  Derm: Denies rash, itchiness, skin lesions or unhealing ulcers. Psych: Denies depression, anxiety or memory loss.  Heme: Denies bruising, easy  bleeding. Neuro:  Denies headaches, dizziness or paresthesias. Endo:  Denies any problems with DM, thyroid or adrenal function.  PHYSICAL EXAM: There were no vitals taken for this visit. BP (!) 148/84   Pulse (!) 119   Ht 5' 2.5" (1.588 m)   Wt 182 lb 2 oz (82.6 kg)   BMI 32.78 kg/m   Wt Readings from Last 3 Encounters:  11/20/21 182 lb 2 oz (82.6 kg)  08/20/21 215 lb 13.3 oz (97.9 kg)  12/09/10 195 lb 4.8 oz (88.6 kg)    General: 41 year old female in NAD.  Head: Normocephalic and atraumatic. Eyes:  Sclerae non-icteric, conjunctive pink. Ears: Normal auditory acuity. Mouth: Dentition intact. No ulcers or lesions.  Neck: Supple, no lymphadenopathy or thyromegaly.  Lungs: Clear bilaterally to auscultation without wheezes, crackles or rhonchi. Heart: Regular rate and rhythm. No murmur, rub or gallop appreciated.  Abdomen: Soft, nontender, non distended. No masses. No hepatosplenomegaly. Normoactive bowel sounds x 4 quadrants.  Rectal: Deferred.  Musculoskeletal: Symmetrical with no gross deformities. Skin: Warm and dry. No rash or lesions on visible extremities. Extremities: No edema. Neurological: Alert oriented x 4, no focal deficits.  Psychological:  Alert and cooperative. Normal mood and affect.  ASSESSMENT AND PLAN:  107) 41 year old female admitted to the hospital 08/2021 with N/V/D abdominal pain. CTAP showed marked wall thickening to the terminal ileum and proximal ascending colon concerning for infectious vs inflammatory ileitis/colitis. Alcohol use likely a contributing factor.  -Diagnostic colonoscopy benefits and risks discussed including risk with sedation, risk of bleeding, perforation and infection  -Further recommendations to be determined after colonoscopy completed   2) Elevated LFTs. AST/ALT ratio consistent with alcohol use + hepatic steatosis.  Negative/normal Hep A, B, C. IgG level elevated with negative/normal ANA, SMA and ANA. Normal ceruloplasmin and iron  levels. TSH 21 may also be a contributing factor. RUQ sono and CTAP showed gallstones and hepatic steatosis.  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 MCV 100.3 -> 80.5. Normal B12, folate and iron levels.  -CBC  4) Hypothyroidism. Restarted on Levothyroxine. -Follow up with PCP    CC:  No ref. provider found

## 2021-11-22 ENCOUNTER — Other Ambulatory Visit: Payer: Self-pay

## 2021-11-22 DIAGNOSIS — E876 Hypokalemia: Secondary | ICD-10-CM

## 2021-11-22 DIAGNOSIS — D649 Anemia, unspecified: Secondary | ICD-10-CM

## 2022-01-10 ENCOUNTER — Encounter: Payer: Medicaid Other | Admitting: Internal Medicine

## 2022-01-10 ENCOUNTER — Telehealth: Payer: Self-pay | Admitting: Internal Medicine

## 2022-01-10 NOTE — Telephone Encounter (Signed)
No charge as had medical issues

## 2022-01-10 NOTE — Telephone Encounter (Signed)
Good morning  Dr. Carlean Purl,  I called this patient this morning at 8:15 she stated  she is having problems with her eyes and could not make this procedure today.  She will call back to reschedule.   I will NO SHOW her   Medicaid

## 2023-02-16 ENCOUNTER — Ambulatory Visit: Payer: Medicaid Other | Admitting: Internal Medicine

## 2023-02-16 VITALS — BP 130/88 | HR 89 | Ht 62.5 in | Wt 238.0 lb

## 2023-02-16 DIAGNOSIS — R635 Abnormal weight gain: Secondary | ICD-10-CM | POA: Diagnosis not present

## 2023-02-16 DIAGNOSIS — E890 Postprocedural hypothyroidism: Secondary | ICD-10-CM | POA: Diagnosis not present

## 2023-02-16 NOTE — Progress Notes (Unsigned)
Name: Marissa Doyle  MRN/ DOB: [id number], [birth year]    Age/ Sex: 42 y.o., female    PCP: Levonne Lapping, NP   Reason for Endocrinology Evaluation: Hypothyroidism     Date of Initial Endocrinology Evaluation: 02/16/2023     HPI: [name] is a 42 y.o. female with a past medical history of severe hepatic steatosis, anemia, HTN , hx of pancreatitis . The patient presented for initial endocrinology clinic visit on 02/16/2023 for consultative assistance with her Hypothyroidism.   Patient has been diagnosed with hyperthyroidism secondary to Graves' Disease at the age of 20 . She required RAI ablation   She has been on levothyroxine   No local neck swelling  She has noted supraclavicular fullness which is intermittent  Has occasional palpitations  Has mild tremors  Denies constipation or  diarrhea  No Biotin  She has been noted with weight gain , she has been noted with hypokalemia  She has hx of ETOH abuse .Has been  sober 08/2021 No hirsutism or acne   Maternal history of thyroid disease  No steroid intake   Levothyroxine 200 mcg daily     HISTORY:  Past Medical History:  Past Medical History:  Diagnosis Date   Abnormal Pap smear    Anemia    Chlamydia    Cholelithiasis    Ectopic pregnancy    Received MTX   Gallstones    Gonorrhea    Hepatic steatosis    Hypertension    Hypothyroidism    due to hx radiation   Macrocytic anemia    Ovarian cyst    Pancreatitis    Trichomonas    Past Surgical History:  Past Surgical History:  Procedure Laterality Date   WISDOM TOOTH EXTRACTION Bilateral     Social History:  reports that she has been smoking cigarettes. She has a 2.5 pack-year smoking history. She has never used smokeless tobacco. She reports that she does not currently use alcohol. She reports that she does not use drugs. Family History: family history includes Arthritis in her father; Colon cancer in her cousin; Crohn's disease in her cousin; Diabetes  in her maternal aunt and mother; High Cholesterol in her mother; Hypertension in her mother and sister; Lupus in her sister.   HOME MEDICATIONS: Allergies as of 02/16/2023   No Known Allergies      Medication List        Accurate as of February 16, 2023 10:27 AM. If you have any questions, ask your nurse or doctor.          cetirizine 10 MG tablet Commonly known as: ZYRTEC SMARTSIG:1.0 Tablet(s) By Mouth Daily   ferrous gluconate 324 MG tablet Commonly known as: FERGON Take 1 tablet by mouth daily.   fluticasone 50 MCG/ACT nasal spray Commonly known as: FLONASE Place 1 spray into both nostrils daily.   J & J BURN CREAM EX Apply 1 packet topically 2 (two) times daily as needed (Burn relief).   levothyroxine 150 MCG tablet Commonly known as: SYNTHROID Take 1 tablet (150 mcg total) by mouth daily at 6 (six) AM.   levothyroxine 200 MCG tablet Commonly known as: SYNTHROID SMARTSIG:1.0 Tablet(s) By Mouth Daily   losartan 25 MG tablet Commonly known as: COZAAR Take 25 mg by mouth daily.   Mirena (52 MG) 20 MCG/DAY Iud Generic drug: levonorgestrel by Intrauterine route.   NIFEdipine 30 MG 24 hr tablet Commonly known as: ADALAT CC Take 30 mg by mouth daily.  What changed: Another medication with the same name was removed. Continue taking this medication, and follow the directions you see here.   Ventolin HFA 108 (90 Base) MCG/ACT inhaler Generic drug: albuterol Inhale 2 puffs into the lungs every 4 (four) hours as needed.          REVIEW OF SYSTEMS: A comprehensive ROS was conducted with the patient and is negative except as per HPI     OBJECTIVE:  VS: BP 130/88 (BP Location: Left Arm, Patient Position: Sitting, Cuff Size: Large)   Pulse 89   Ht 5' 2.5" (1.588 m)   Wt 238 lb (108 kg)   SpO2 99%   BMI 42.84 kg/m    Wt Readings from Last 3 Encounters:  02/16/23 238 lb (108 kg)  11/20/21 182 lb 2 oz (82.6 kg)  08/20/21 215 lb 13.3 oz (97.9 kg)      EXAM: General: Pt appears well and is in NAD  Eyes: External eye exam normal without stare, lid lag but has minimal right eye proptosis  Neck: General: Supple without adenopathy. Thyroid: Thyroid size normal.  No goiter or nodules appreciated.   Lungs: Clear with good BS bilat   Heart: Auscultation: RRR.  Abdomen: Soft, nontender  Extremities:  BL LE: No pretibial edema   Mental Status: Judgment, insight: Intact Orientation: Oriented to time, place, and person Mood and affect: No depression, anxiety, or agitation     DATA REVIEWED: ***    ASSESSMENT/PLAN/RECOMMENDATIONS:   Postablative hypothyroidism   -Patient is clinically euthyroid -No local neck symptoms - Pt educated extensively on the correct way to take levothyroxine (first thing in the morning with water, 30 minutes before eating or taking other medications). - Pt encouraged to double dose the following day if she were to miss a dose given long half-life of levothyroxine.   Medications : Levothyroxine 200 mcg daily   2.  Weight gain:  -Patient with cushingoid features including weight gain, supraclavicular fullness, history of hypokalemia -Will proceed with 24-hour urinary cortisol  Follow-up in 3 months  Signed electronically by: Lyndle Herrlich, MD  [facility] End[facility]  [facility] [address] [place] [place], [place] [address] Phone: [phone] FAX: [phone]   CC: Levonne Lapping, NP [address] [place] [address] Phone: [phone] Fax: [phone]   Return to Endocrinology clinic as below: Future Appointments  Date Time Provider Department Center  02/16/2023 10:30 AM Zana Biancardi, Konrad Dolores, MD [facility] None

## 2023-02-16 NOTE — Patient Instructions (Signed)
You are on levothyroxine - which is your thyroid hormone supplement. You MUST take this consistently. ° °You should take this first thing in the morning on an empty stomach with water. You should not take it with other medications. Wait 30min to 1hr prior to eating. If you are taking any vitamins - please take these in the evening.  ° °If you miss a dose, please take your missed dose the following day (double the dose for that day). °You should have a pill box for ONLY levothyroxine on your bedside table to help you remember to take your medications. ° ° ° °24-Hour Urine Collection ° °You will be collecting your urine for a 24-hour period of time. °Your timer starts with your first urine of the morning (For example - If you first pee at 9AM, your timer will start at 9AM) °Throw away your first urine of the morning °Collect your urine every time you pee for the next 24 hours °STOP your urine collection 24 hours after you started the collection (For example - You would stop at 9AM the day after you started) ° °

## 2023-02-17 MED ORDER — LEVOTHYROXINE SODIUM 200 MCG PO TABS: 200.0000 ug | ORAL_TABLET | ORAL | 3 refills | Status: DC

## 2023-02-19 LAB — ACTH: C206 ACTH: 28 pg/mL (ref 6–50)

## 2023-02-19 LAB — CORTISOL: Cortisol, Plasma: 5.2 ug/dL

## 2023-02-19 LAB — TSH: TSH: 0.39 m[IU]/L — ABNORMAL LOW

## 2023-02-19 LAB — T4, FREE: Free T4: 1.2 ng/dL (ref 0.8–1.8)

## 2023-04-29 ENCOUNTER — Ambulatory Visit: Payer: Medicaid Other | Admitting: Gastroenterology

## 2023-05-18 ENCOUNTER — Encounter: Payer: Self-pay | Admitting: Internal Medicine

## 2023-05-18 ENCOUNTER — Ambulatory Visit (INDEPENDENT_AMBULATORY_CARE_PROVIDER_SITE_OTHER): Payer: Medicaid Other | Admitting: Internal Medicine

## 2023-05-18 VITALS — BP 126/74 | HR 96 | Ht 62.5 in | Wt 243.0 lb

## 2023-05-18 DIAGNOSIS — E890 Postprocedural hypothyroidism: Secondary | ICD-10-CM | POA: Diagnosis not present

## 2023-05-18 NOTE — Progress Notes (Unsigned)
 Name: Marissa Doyle  MRN/ DOB: [id number], October 29, 1980    Age/ Sex: 44 y.o., female    PCP: Levonne Lapping, NP   Reason for Endocrinology Evaluation: Hypothyroidism     Date of Initial Endocrinology Evaluation: 02/16/2023    HPI: [name] is a 43 y.o. female with a past medical history of severe hepatic steatosis, anemia, HTN , hx of pancreatitis . The patient presented for initial endocrinology clinic visit on 02/16/2023  for consultative assistance with her Hypothyroidism.   Patient has been diagnosed with hyperthyroidism secondary to Graves' Disease at the age of 3 . She required RAI ablation   She has been on levothyroxine    Maternal history of thyroid disease    SUBJECTIVE:    Today (05/18/23): Marissa Doyle is here for a follow up on hypothyroidism.   Pt continues with weight gain  She did not bring her 24- hr urine due to transportation issues  Had recent URI , noted lymphadenopathy Continues with  occasional palpitations  Continues with mild tremors  Denies constipation or  diarrhea  Denies eye symptoms, has tearing of the eyes, follows with ophthalmology  No Biotin   Levothyroxine 200 mcg ,  half a tablet on Sundays and 1 tablet rest of the week      HISTORY:  Past Medical History:  Past Medical History:  Diagnosis Date   Abnormal Pap smear    Anemia    Chlamydia    Cholelithiasis    Ectopic pregnancy    Received MTX   Gallstones    Gonorrhea    Hepatic steatosis    Hypertension    Hypothyroidism    due to hx radiation   Macrocytic anemia    Ovarian cyst    Pancreatitis    Trichomonas    Past Surgical History:  Past Surgical History:  Procedure Laterality Date   WISDOM TOOTH EXTRACTION Bilateral     Social History:  reports that she has been smoking cigarettes. She has a 2.5 pack-year smoking history. She has never used smokeless tobacco. She reports that she does not currently use alcohol. She reports that she does not use  drugs. Family History: family history includes Arthritis in her father; Colon cancer in her cousin; Crohn's disease in her cousin; Diabetes in her maternal aunt and mother; High Cholesterol in her mother; Hypertension in her mother and sister; Lupus in her sister.   HOME MEDICATIONS: Allergies as of 05/18/2023   No Known Allergies      Medication List        Accurate as of May 18, 2023  9:18 AM. If you have any questions, ask your nurse or doctor.          cetirizine 10 MG tablet Commonly known as: ZYRTEC SMARTSIG:1.0 Tablet(s) By Mouth Daily   ferrous gluconate 324 MG tablet Commonly known as: FERGON Take 1 tablet by mouth daily.   fluticasone 50 MCG/ACT nasal spray Commonly known as: FLONASE Place 1 spray into both nostrils daily.   J & J BURN CREAM EX Apply 1 packet topically 2 (two) times daily as needed (Burn relief).   levothyroxine 200 MCG tablet Commonly known as: SYNTHROID Take 1 tablet (200 mcg total) by mouth as directed. Half a tablet on Sundays and 1 tablet rest of the week   losartan 25 MG tablet Commonly known as: COZAAR Take 25 mg by mouth daily.   Mirena (52 MG) 20 MCG/DAY Iud Generic drug: levonorgestrel by  Intrauterine route.   NIFEdipine 30 MG 24 hr tablet Commonly known as: ADALAT CC Take 30 mg by mouth daily.   Symbicort 80-4.5 MCG/ACT inhaler Generic drug: budesonide-formoterol Inhale into the lungs.   Ventolin HFA 108 (90 Base) MCG/ACT inhaler Generic drug: albuterol Inhale 2 puffs into the lungs every 4 (four) hours as needed.   Vitamin D (Ergocalciferol) 1.25 MG (50000 UNIT) Caps capsule Commonly known as: DRISDOL Take 50,000 Units by mouth once a week.          REVIEW OF SYSTEMS: A comprehensive ROS was conducted with the patient and is negative except as per HPI     OBJECTIVE:  VS: BP 126/74 (BP Location: Left Arm, Patient Position: Sitting, Cuff Size: Small)   Pulse 96   Ht 5' 2.5" (1.588 m)   Wt 243 lb (110.2  kg)   SpO2 96%   BMI 43.74 kg/m    Wt Readings from Last 3 Encounters:  05/18/23 243 lb (110.2 kg)  02/16/23 238 lb (108 kg)  11/20/21 182 lb 2 oz (82.6 kg)     EXAM: General: Pt appears well and is in NAD  Eyes: Right eye proptosis noted   Neck: General: Supple without adenopathy. Thyroid: Thyroid size normal.  No goiter or nodules appreciated.   Lungs: Clear with good BS bilat   Heart: Auscultation: RRR.  Extremities:  BL LE: No pretibial edema   Mental Status: Judgment, insight: Intact Orientation: Oriented to time, place, and person Mood and affect: No depression, anxiety, or agitation     DATA REVIEWED:     Latest Reference Range & Units 02/16/23 11:10  TSH mIU/L 0.39 (L)  T4,Free(Direct) 0.8 - 1.8 ng/dL 1.2  (L): Data is abnormally low  ASSESSMENT/PLAN/RECOMMENDATIONS:   Postablative hypothyroidism   -Patient is clinically euthyroid -No local neck symptoms -Patient continues with low TSH, I will decrease levothyroxine as below  Medications : Stop levothyroxine 200 mcg , half a tablet on Sundays and 1 tablet rest of the week  Start levothyroxine 175 mcg daily   Follow-up in 6 months  Signed electronically by: Lyndle Herrlich, MD  [facility] Endocrin[facility]  [facility] [address] [place] [place], [place] [address] Phone: [phone] FAX: [phone]   CC: Levonne Lapping, NP [address] [place] [address] Phone: [phone] Fax: [phone]   Return to Endocrinology clinic as below: Future Appointments  Date Time Provider Department Center  05/18/2023  9:50 AM Dyanna Seiter, Konrad Dolores, MD [facility] None  06/03/2023 10:40 AM McMichael, Saddie Benders, PA-C [facility]

## 2023-05-19 ENCOUNTER — Encounter: Payer: Self-pay | Admitting: Internal Medicine

## 2023-05-19 LAB — T4, FREE: Free T4: 1.4 ng/dL (ref 0.8–1.8)

## 2023-05-19 LAB — TSH: TSH: 0.33 m[IU]/L — ABNORMAL LOW

## 2023-05-19 MED ORDER — LEVOTHYROXINE SODIUM 175 MCG PO TABS: 175.0000 ug | ORAL_TABLET | Freq: Every day | ORAL | 3 refills | Status: AC

## 2023-06-03 ENCOUNTER — Encounter: Payer: Self-pay | Admitting: Gastroenterology

## 2023-06-03 ENCOUNTER — Ambulatory Visit: Payer: Medicaid Other | Admitting: Gastroenterology

## 2023-06-03 VITALS — BP 124/78 | HR 96 | Ht 62.0 in | Wt 246.1 lb

## 2023-06-03 DIAGNOSIS — R933 Abnormal findings on diagnostic imaging of other parts of digestive tract: Secondary | ICD-10-CM | POA: Diagnosis not present

## 2023-06-03 DIAGNOSIS — R9389 Abnormal findings on diagnostic imaging of other specified body structures: Secondary | ICD-10-CM

## 2023-06-03 DIAGNOSIS — Z1211 Encounter for screening for malignant neoplasm of colon: Secondary | ICD-10-CM

## 2023-06-03 MED ORDER — SUFLAVE 178.7 G PO SOLR: 1.0000 | Freq: Once | ORAL | 0 refills | Status: AC

## 2023-06-03 NOTE — Patient Instructions (Addendum)
 You have been scheduled for a colonoscopy. Please follow written instructions given to you at your visit today.   If you use inhalers (even only as needed), please bring them with you on the day of your procedure.  ___________________________________________________________________________  Bonita Quin will receive your bowel preparation through [facility], which ensures the lowest copay and home delivery, with outreach via text or call from an [phone] number. Please respond promptly to avoid rescheduling of your procedure. If you are interested in alternative options or have any questions regarding your prep, please contact them at [phone] ____________________________________________________________________________  Your Provider Has Sent Your Bowel Prep Regimen To [facility]   [facility] will contact you to verify your information and collect your copay, if applicable. Enjoy the comfort of your home while your prescription is mailed to you, FREE of any shipping charges.   [facility] accepts all major insurance benefits and applies discounts & coupons.  Have additional questions?   Chat: [email or web address] Call: [phone] Email: [email or web address] NCPDP: [id number]  How will [facility] contact you?  With a Welcome phone call,  a Welcome text and a checkout link in text form.  Texts you receive from [phone] Are NOT Spam.  *To set up delivery, you must complete the checkout process via link or speak to one of the patient care representatives. If [facility] is unable to reach you, your prescription may be delayed.  To avoid long hold times on the phone, you may also utilize the secure chat feature on the [facility] website to request that they call you back for transaction completion or to expedite your concerns.  Due to recent changes in healthcare laws, you may see the results of your imaging and laboratory studies on MyChart before your provider has had a chance to  review them.  We understand that in some cases there may be results that are confusing or concerning to you. Not all laboratory results come back in the same time frame and the provider may be waiting for multiple results in order to interpret others.  Please give [place] 48 hours in order for your provider to thoroughly review all the results before contacting the office for clarification of your results.   _______________________________________________________  If your blood pressure at your visit was 140/90 or greater, please contact your primary care physician to follow up on this.  _______________________________________________________  If you are age 17 or older, your body mass index should be between 23-30. Your Body mass index is 45.02 kg/m. If this is out of the aforementioned range listed, please consider follow up with your Primary Care Provider.  If you are age 62 or younger, your body mass index should be between 19-25. Your Body mass index is 45.02 kg/m. If this is out of the aformentioned range listed, please consider follow up with your Primary Care Provider.   ________________________________________________________  The [facility] providers would like to encourage you to use [facility] to communicate with providers for non-urgent requests or questions.  Due to long hold times on the telephone, sending your provider a message by [facility] may be a faster and more efficient way to get a response.  Please allow 48 business hours for a response.  Please remember that this is for non-urgent requests.  _______________________________________________________

## 2023-06-03 NOTE — Progress Notes (Signed)
 Chief Complaint: Abnormal CT follow up Primary GI MD: Dr. Leone Payor  HPI: Discussed the use of AI scribe software for clinical note transcription with the patient, who gave verbal consent to proceed.  History of Present Illness   Marissa Doyle is a 43 year old female who presents for follow-up regarding a previous abnormal CT scan suggestive of inflammatory bowel disease.   Last seen 11/2021 by Alcide Evener, please see this note for details.  In June 2023, a CT scan revealed wall thickening in the terminal ileum, raising concerns for inflammatory bowel disease. She has not yet undergone a colonoscopy, which was previously scheduled but not completed.  Currently, she has no symptoms such as loose stools, abdominal pain, nausea, or vomiting and feels well overall.  Her past medical history includes a significant lifestyle change, having abstained from alcohol for two years. She denies the use of ibuprofen or other over-the-counter pain medications but mentions a high intake of acidic foods and drinks in the past.  Family history is notable for a grandfather with colon cancer. There is no immediate family history of inflammatory bowel disease, though a second cousin may have had similar issues.     Past Medical History:  Diagnosis Date   Abnormal Pap smear    Anemia    Chlamydia    Cholelithiasis    Ectopic pregnancy    Received MTX   Gallstones    Gonorrhea    Hepatic steatosis    Hypertension    Hypothyroidism    due to hx radiation   Macrocytic anemia    Ovarian cyst    Pancreatitis    Trichomonas     Past Surgical History:  Procedure Laterality Date   WISDOM TOOTH EXTRACTION Bilateral     Current Outpatient Medications  Medication Sig Dispense Refill   cetirizine (ZYRTEC) 10 MG tablet SMARTSIG:1.0 Tablet(s) By Mouth Daily     Emollient (J & J BURN CREAM EX) Apply 1 packet topically 2 (two) times daily as needed (Burn relief).     ferrous gluconate (FERGON)  324 MG tablet Take 1 tablet by mouth daily.     fluticasone (FLONASE) 50 MCG/ACT nasal spray Place 1 spray into both nostrils daily.     levonorgestrel (MIRENA, 52 MG,) 20 MCG/DAY IUD by Intrauterine route.     levothyroxine (SYNTHROID) 175 MCG tablet Take 1 tablet (175 mcg total) by mouth daily. 90 tablet 3   losartan (COZAAR) 25 MG tablet Take 25 mg by mouth daily.     NIFEdipine (ADALAT CC) 30 MG 24 hr tablet Take 30 mg by mouth daily.     PEG 3350-KCl-NaCl-NaSulf-MgSul (SUFLAVE) 178.7 g SOLR Take 1 kit by mouth once for 1 dose. 1 each 0   SYMBICORT 80-4.5 MCG/ACT inhaler Inhale into the lungs.     VENTOLIN HFA 108 (90 Base) MCG/ACT inhaler Inhale 2 puffs into the lungs every 4 (four) hours as needed.     Vitamin D, Ergocalciferol, (DRISDOL) 1.25 MG (50000 UNIT) CAPS capsule Take 50,000 Units by mouth once a week.     No current facility-administered medications for this visit.    Allergies as of 06/03/2023   (No Known Allergies)    Family History  Problem Relation Age of Onset   Hypertension Mother    High Cholesterol Mother    Diabetes Mother    Arthritis Father    Hypertension Sister    Lupus Sister    Diabetes Maternal Aunt    Colon cancer  Cousin    Crohn's disease Cousin     Social History   Socioeconomic History   Marital status: Single    Spouse name: Not on file   Number of children: 4   Years of education: Not on file   Highest education level: Not on file  Occupational History   Not on file  Tobacco Use   Smoking status: Every Day    Current packs/day: 0.25    Average packs/day: 0.3 packs/day for 10.0 years (2.5 ttl pk-yrs)    Types: Cigarettes   Smokeless tobacco: Never  Vaping Use   Vaping status: Never Used  Substance and Sexual Activity   Alcohol use: Not Currently   Drug use: No   Sexual activity: Yes    Birth control/protection: Condom  Other Topics Concern   Not on file  Social History Narrative   Not on file   Social Drivers of Health    Financial Resource Strain: Not on file  Food Insecurity: Not on file  Transportation Needs: Not on file  Physical Activity: Not on file  Stress: Not on file  Social Connections: Not on file  Intimate Partner Violence: Not on file    Review of Systems:    Constitutional: No weight loss, fever, chills, weakness or fatigue HEENT: Eyes: No change in vision               Ears, Nose, Throat:  No change in hearing or congestion Skin: No rash or itching Cardiovascular: No chest pain, chest pressure or palpitations   Respiratory: No SOB or cough Gastrointestinal: See HPI and otherwise negative Genitourinary: No dysuria or change in urinary frequency Neurological: No headache, dizziness or syncope Musculoskeletal: No new muscle or joint pain Hematologic: No bleeding or bruising Psychiatric: No history of depression or anxiety    Physical Exam:  Vital signs: BP 124/78   Pulse 96   Ht 5\' 2"  (1.575 m)   Wt 246 lb 2 oz (111.6 kg)   BMI 45.02 kg/m   Constitutional: NAD, Well developed, Well nourished, alert and cooperative Head:  Normocephalic and atraumatic. Eyes:   PEERL, EOMI. No icterus. Conjunctiva pink. Respiratory: Respirations even and unlabored. Lungs clear to auscultation bilaterally.   No wheezes, crackles, or rhonchi.  Cardiovascular:  Regular rate and rhythm. No peripheral edema, cyanosis or pallor.  Gastrointestinal:  Soft, nondistended, nontender. No rebound or guarding. Normal bowel sounds. No appreciable masses or hepatomegaly. Rectal:  Not performed.  Msk:  Symmetrical without gross deformities. Without edema, no deformity or joint abnormality.  Neurologic:  Alert and  oriented x4;  grossly normal neurologically.  Skin:   Dry and intact without significant lesions or rashes. Psychiatric: Oriented to person, place and time. Demonstrates good judgement and reason without abnormal affect or behaviors.   RELEVANT LABS AND IMAGING: CBC    Component Value Date/Time    WBC 8.2 11/20/2021 1233   RBC 3.73 (L) 11/20/2021 1233   HGB 9.7 (L) 11/20/2021 1233   HCT 30.0 (L) 11/20/2021 1233   PLT 346.0 11/20/2021 1233   MCV 80.5 11/20/2021 1233   MCH 32.5 08/21/2021 0241   MCHC 32.2 11/20/2021 1233   RDW 15.5 11/20/2021 1233   LYMPHSABS 1.9 11/20/2021 1233   MONOABS 0.6 11/20/2021 1233   EOSABS 0.4 11/20/2021 1233   BASOSABS 0.1 11/20/2021 1233    CMP     Component Value Date/Time   NA 136 11/20/2021 1233   K 3.4 (L) 11/20/2021 1233   CL 101  11/20/2021 1233   CO2 28 11/20/2021 1233   GLUCOSE 97 11/20/2021 1233   BUN 9 11/20/2021 1233   CREATININE 0.60 11/20/2021 1233   CALCIUM 9.6 11/20/2021 1233   PROT 7.4 11/20/2021 1233   ALBUMIN 3.5 11/20/2021 1233   AST 10 11/20/2021 1233   ALT 5 11/20/2021 1233   ALKPHOS 89 11/20/2021 1233   BILITOT 0.4 11/20/2021 1233   GFRNONAA >60 08/21/2021 0241   GFRAA >60 11/12/2010 0515     Assessment/Plan:      Abnormal CT scan Abnormal CT scan showed terminal ileum wall thickening. Asymptomatic. No family history of inflammatory bowel disease. Less likely to be IBD in the absence of symptoms though cannot rule out. She wasn't taking NSAIDs at that time so less likely NSAID induced ileitis. She is due for her first screening colonoscopy in July as well. Normal recent CBC and CMP. Recent TSH 0.33. - schedule colonoscopy - I thoroughly discussed the procedure with the patient (at bedside) to include nature of the procedure, alternatives, benefits, and risks (including but not limited to bleeding, infection, perforation, anesthesia/cardiac pulmonary complications).  Patient verbalized understanding and gave verbal consent to proceed with procedure.   Colorectal Cancer Screening Approaching age for routine screening. Family history of colon cancer in grandfather.       This visit required 35 minutes of patient care (this includes precharting, chart review, review of results, face-to-face time used for  counseling as well as treatment plan and follow-up. The patient was provided an opportunity to ask questions and all were answered. The patient agreed with the plan and demonstrated an understanding of the instructions.   Lara Mulch [facility] 06/03/2023, 12:17 PM  Cc: McCaskill-Gainey, Tenes*

## 2023-07-14 NOTE — Progress Notes (Unsigned)
 [facility] History and Physical   Primary Care Physician:  Tann, Samandra, NP   Reason for Procedure:   Abnormal terminal ileum on CT scan  Plan:    Colonoscopy also f/u CBC     HPI: Marissa Doyle is a 43 y.o. female with hx of thickened terminal ileum on a CT scan in 2023 that raised ? Of Crohn's disease She saw GI PA McMichael 06/03/23 and colonoscopy arranged to evaluate. 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 Authorizing Provider  cetirizine (ZYRTEC) 10 MG tablet SMARTSIG:1.0 Tablet(s) By Mouth Daily 12/03/22   [provider]  Emollient (J & J BURN CREAM EX) Apply 1 packet topically 2 (two) times daily as needed (Burn relief).    [provider]  ferrous gluconate (FERGON) 324 MG tablet Take 1 tablet by mouth daily. 10/02/21   [provider]  fluticasone (FLONASE) 50 MCG/ACT nasal spray Place 1 spray into both nostrils daily. 12/29/22   [provider]  levonorgestrel (MIRENA, 52 MG,) 20 MCG/DAY IUD by Intrauterine route. 12/05/21   [provider]  levothyroxine  (SYNTHROID ) 175 MCG tablet Take 1 tablet (175 mcg total) by  mouth daily. 05/19/23   Shamleffer, Ibtehal Jaralla, MD  losartan (COZAAR) 25 MG tablet Take 25 mg by mouth daily. 01/01/23   [provider]  NIFEdipine (ADALAT CC) 30 MG 24 hr tablet Take 30 mg by mouth daily. 01/01/23   [provider]  SYMBICORT 80-4.5 MCG/ACT inhaler Inhale into the lungs. 04/20/23   [provider]  VENTOLIN HFA 108 (90 Base) MCG/ACT inhaler Inhale 2 puffs into the lungs every 4 (four) hours as needed. 12/29/22   [provider]  Vitamin D, Ergocalciferol, (DRISDOL) 1.25 MG (50000 UNIT) CAPS capsule Take 50,000 Units by mouth once a week. 04/30/23   [provider]  medroxyPROGESTERone  (DEPO-PROVERA ) 150 MG/ML injection Inject 150 mg into the muscle every 3 (three) months.    12/09/10  [provider]    Current Outpatient Medications  Medication Sig Dispense Refill   cetirizine (ZYRTEC) 10 MG tablet SMARTSIG:1.0 Tablet(s) By Mouth Daily     Emollient (J & J BURN CREAM EX) Apply 1 packet topically 2 (two) times daily as needed (Burn relief).     ferrous gluconate (FERGON) 324 MG tablet Take 1 tablet by mouth  daily.     fluticasone (FLONASE) 50 MCG/ACT nasal spray Place 1 spray into both nostrils daily.     levonorgestrel (MIRENA, 52 MG,) 20 MCG/DAY IUD by Intrauterine route.     levothyroxine  (SYNTHROID ) 175 MCG tablet Take 1 tablet (175 mcg total) by mouth daily. 90 tablet 3   losartan (COZAAR) 25 MG tablet Take 25 mg by mouth daily.     NIFEdipine (ADALAT CC) 30 MG 24 hr tablet Take 30 mg by mouth daily.     SYMBICORT 80-4.5 MCG/ACT inhaler Inhale into the lungs.     VENTOLIN HFA 108 (90 Base) MCG/ACT inhaler Inhale 2 puffs into the lungs every 4 (four) hours as needed.     Vitamin D, Ergocalciferol, (DRISDOL) 1.25 MG (50000 UNIT) CAPS capsule Take 50,000 Units by mouth once a week.     No current facility-administered medications for this visit.    Allergies as of 07/15/2023   (No Known Allergies)    Family  History  Problem Relation Age of Onset   Hypertension Mother    High Cholesterol Mother    Diabetes Mother    Arthritis Father    Hypertension Sister    Lupus Sister    Diabetes Maternal Aunt    Colon cancer Cousin    Crohn's disease Cousin     Social History   Socioeconomic History   Marital status: Single    Spouse name: Not on file   Number of children: 4   Years of education: Not on file   Highest education level: Not on file  Occupational History   Not on file  Tobacco Use   Smoking status: Every Day    Current packs/day: 0.25    Average packs/day: 0.3 packs/day for 10.0 years (2.5 ttl pk-yrs)    Types: Cigarettes   Smokeless tobacco: Never  Vaping Use   Vaping status: Never Used  Substance and Sexual Activity   Alcohol use: Not Currently   Drug use: No   Sexual activity: Yes    Birth control/protection: Condom  Other Topics Concern   Not on file  Social History Narrative   Not on file   Social Drivers of Health   Financial Resource Strain: Not on file  Food Insecurity: Not on file  Transportation Needs: Not on file  Physical Activity: Not on file  Stress: Not on file  Social Connections: Not on file  Intimate Partner Violence: Not on file    Review of Systems: Positive for *** All other review of systems negative except as mentioned in the HPI.  Physical Exam: Vital signs There were no vitals taken for this visit.  General:   Alert,  Well-developed, well-nourished, pleasant and cooperative in NAD Lungs:  Clear throughout to auscultation.   Heart:  Regular rate and rhythm; no murmurs, clicks, rubs,  or gallops. Abdomen:  Soft, nontender and nondistended. Normal bowel sounds.   Neuro/Psych:  Alert and cooperative. Normal mood and affect. A and O x 3   @Cariann Kinnamon  [name], MD, [facility] [phone] (pager) 07/14/2023 8:22 PM@

## 2023-07-15 ENCOUNTER — Ambulatory Visit: Admitting: Internal Medicine

## 2023-07-15 ENCOUNTER — Encounter: Payer: Self-pay | Admitting: Internal Medicine

## 2023-07-15 VITALS — BP 123/72 | HR 87 | Temp 97.9°F | Resp 26 | Ht 62.0 in | Wt 246.0 lb

## 2023-07-15 DIAGNOSIS — D125 Benign neoplasm of sigmoid colon: Secondary | ICD-10-CM

## 2023-07-15 DIAGNOSIS — K5730 Diverticulosis of large intestine without perforation or abscess without bleeding: Secondary | ICD-10-CM | POA: Diagnosis not present

## 2023-07-15 DIAGNOSIS — K6389 Other specified diseases of intestine: Secondary | ICD-10-CM

## 2023-07-15 DIAGNOSIS — R9389 Abnormal findings on diagnostic imaging of other specified body structures: Secondary | ICD-10-CM

## 2023-07-15 MED ORDER — SODIUM CHLORIDE 0.9 % IV SOLN
500.0000 mL | Freq: Once | INTRAVENOUS | Status: DC
Start: 2023-07-15 — End: 2023-07-15

## 2023-07-15 NOTE — Progress Notes (Signed)
 Called to room to assist during endoscopic procedure.  Patient ID and intended procedure confirmed with present staff. Received instructions for my participation in the procedure from the performing physician.

## 2023-07-15 NOTE — Progress Notes (Signed)
 Pt's states no medical or surgical changes since previsit or office visit.

## 2023-07-15 NOTE — Progress Notes (Signed)
 Vss nad trans to pacu

## 2023-07-15 NOTE — Op Note (Signed)
 [facility] Patient Name: Marissa Doyle Procedure Date: 07/15/2023 10:15 AM MRN: [id number] Endoscopist: Kenney Peacemaker , MD, [id number] Age: 43 Referring MD:  Date of Birth: 1980-05-11 Gender: Female Account #: [id number] Procedure:                Colonoscopy Indications:              Abnormal CT of the GI tract Medicines:                Monitored Anesthesia Care Procedure:                Pre-Anesthesia Assessment:                           - Prior to the procedure, a History and Physical                            was performed, and patient medications and                            allergies were reviewed. The patient's tolerance of                            previous anesthesia was also reviewed. The risks                            and benefits of the procedure and the sedation                            options and risks were discussed with the patient.                            All questions were answered, and informed consent                            was obtained. Prior Anticoagulants: The patient has                            taken no anticoagulant or antiplatelet agents. ASA                            Grade Assessment: III - A patient with severe                            systemic disease. After reviewing the risks and                            benefits, the patient was deemed in satisfactory                            condition to undergo the procedure.                           After obtaining informed consent, the colonoscope  was passed under direct vision. Throughout the                            procedure, the patient's blood pressure, pulse, and                            oxygen saturations were monitored continuously. The                            Olympus Scope SN [id number] was introduced through the                            anus and advanced to the the terminal ileum, with                            identification of the  appendiceal orifice and IC                            valve. The colonoscopy was performed without                            difficulty. The patient tolerated the procedure                            fairly well. The quality of the bowel preparation                            was good. The terminal ileum, ileocecal valve,                            appendiceal orifice, and rectum were photographed. Scope In: 10:33:46 AM Scope Out: 10:50:25 AM Scope Withdrawal Time: 0 hours 14 minutes 57 seconds  Total Procedure Duration: 0 hours 16 minutes 39 seconds  Findings:                 The perianal and digital rectal examinations were                            normal.                           A patchy area of mucosa in the terminal ileum was                            nodular. Biopsies were taken with a cold forceps                            for histology. Verification of patient                            identification for the specimen was done. Estimated                            blood loss was minimal.  A 12 mm polyp was found in the sigmoid colon. The                            polyp was pedunculated. The polyp was removed with                            a hot snare. Resection and retrieval were complete.                            Verification of patient identification for the                            specimen was done. Estimated blood loss: none.                           Scattered diverticula were found in the entire                            colon.                           The exam was otherwise without abnormality on                            direct and retroflexion views. Complications:            No immediate complications. Estimated Blood Loss:     Estimated blood loss was minimal. Impression:               - Nodular ileal mucosa. Biopsied. 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 Resected and retrieved.                           - Diverticulosis in the entire examined colon.                           - The examination was otherwise normal on  direct                            and retroflexion views. Recommendation:           - Patient has a contact number available for                            emergencies. The signs and symptoms of potential                            delayed complications were discussed with the                            patient. Return to normal activities tomorrow.                            Written discharge instructions were provided to the                            patient.                           - Resume previous diet.                           - Continue present medications.                           - No aspirin, ibuprofen , naproxen, or other                            non-steroidal anti-inflammatory drugs for 2 weeks                            after polyp removal.                           - Repeat colonoscopy is recommended. The                            colonoscopy date will be determined after pathology                            results from today's exam become available for                            review.                           - She says Building services engineer (PCP) has told her anemia                            dseen in 2023 has resoilved (iron, B12 and Folate                            are ok). Kenney Peacemaker,  MD 07/15/2023 11:04:32 AM This report has been signed electronically.

## 2023-07-15 NOTE — Patient Instructions (Addendum)
 I found and removed a colon polyp and took biopsies of the small intestine that was abnormal on the CT scan.  You also have a condition called diverticulosis - common and not usually a problem. Please read the handout provided.  I will let you know pathology results and when to have another routine colonoscopy by mail and/or My Chart.  Please follow-up at [facility] regarding your other health problems, including history of anemia.  I appreciate the opportunity to care for you. Kenney Peacemaker, MD, [facility]    Discharge instructions given. Handouts on polyps and Diverticulosis. No aspirin,ibuprofen ,naproxen,or other non-steroidal anti-inflammatory drugsfor 2 weeks. Resume previous medications. YOU HAD AN ENDOSCOPIC PROCEDURE TODAY AT THE [facility]:   Refer to the procedure report that was given to you for any specific questions about what was found during the examination.  If the procedure report does not answer your questions, please call your gastroenterologist to clarify.  If you requested that your care partner not be given the details of your procedure findings, then the procedure report has been included in a sealed envelope for you to review at your convenience later.  YOU SHOULD EXPECT: Some feelings of bloating in the abdomen. Passage of more gas than usual.  Walking can help get rid of the air that was put into your GI tract during the procedure and reduce the bloating. If you had a lower endoscopy (such as a colonoscopy or flexible sigmoidoscopy) you may notice spotting of blood in your stool or on the toilet paper. If you underwent a bowel prep for your procedure, you may not have a normal bowel movement for a few days.  Please Note:  You might notice some irritation and congestion in your nose or some drainage.  This is from the oxygen used during your procedure.  There is no need for concern and it should clear up in a day or so.  SYMPTOMS TO REPORT  IMMEDIATELY:  Following lower endoscopy (colonoscopy or flexible sigmoidoscopy):  Excessive amounts of blood in the stool  Significant tenderness or worsening of abdominal pains  Swelling of the abdomen that is new, acute  Fever of 100F or higher   For urgent or emergent issues, a gastroenterologist can be reached at any hour by calling [phone]. Do not use MyChart messaging for urgent concerns.    DIET:  We do recommend a small meal at first, but then you may proceed to your regular diet.  Drink plenty of fluids but you should avoid alcoholic beverages for 24 hours.  ACTIVITY:  You should plan to take it easy for the rest of today and you should NOT DRIVE or use heavy machinery until tomorrow (because of the sedation medicines used during the test).    FOLLOW UP: Our staff will call the number listed on your records the next business day following your procedure.  We will call around 7:15- 8:00 am to check on you and address any questions or concerns that you may have regarding the information given to you following your procedure. If we do not reach you, we will leave a message.     If any biopsies were taken you will be contacted by phone or by letter within the next 1-3 weeks.  Please call us  at [phone] if you have not heard about the biopsies in 3 weeks.    SIGNATURES/CONFIDENTIALITY: You and/or your care partner have signed paperwork which will be entered into your electronic medical record.  These signatures attest to the fact that that the information above on your After Visit Summary has been reviewed and is understood.  Full responsibility of the confidentiality of this discharge information lies with you and/or your care-partner.

## 2023-07-16 ENCOUNTER — Telehealth: Payer: Self-pay

## 2023-07-16 NOTE — Telephone Encounter (Signed)
  Follow up Call-     07/15/2023    9:29 AM  Call back number  Post procedure Call Back phone  # [phone]  Permission to leave phone message Yes     Patient questions:  Do you have a fever, pain , or abdominal swelling? No. Pain Score  0 *  Have you tolerated food without any problems? Yes.    Have you been able to return to your normal activities? Yes.    Do you have any questions about your discharge instructions: Diet   No. Medications  No. Follow up visit  No.  Do you have questions or concerns about your Care? Patient has complaints of lower abdominal discomfort, possible gas. Advised to walk around, gas x, warm fluids and to call back if discomfort gets worse or does not go away.   Actions: * If pain score is 4 or above: No action needed, pain <4.

## 2023-07-20 LAB — SURGICAL PATHOLOGY

## 2023-07-22 ENCOUNTER — Ambulatory Visit: Payer: Self-pay | Admitting: Internal Medicine

## 2023-07-22 ENCOUNTER — Encounter: Payer: Self-pay | Admitting: Internal Medicine

## 2023-07-22 DIAGNOSIS — Z860101 Personal history of adenomatous and serrated colon polyps: Secondary | ICD-10-CM | POA: Insufficient documentation

## 2023-11-18 ENCOUNTER — Ambulatory Visit: Admitting: Internal Medicine

## 2023-11-18 NOTE — Progress Notes (Deleted)
 Name: Marissa Doyle  MRN/ DOB: [id number], May 26, 1980    Age/ Sex: 43 y.o., female    PCP: Macdonald Ping, NP   Reason for Endocrinology Evaluation: Hypothyroidism     Date of Initial Endocrinology Evaluation: 02/16/2023    HPI: Marissa Doyle is a 43 y.o. female with a past medical history of severe hepatic steatosis, anemia, HTN , hx of pancreatitis . The patient presented for initial endocrinology clinic visit on 02/16/2023  for consultative assistance with her Hypothyroidism.   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 She required RAI ablation   She has been on levothyroxine     Maternal history of thyroid  disease    She was unable to bring 24-hour urinary cortisol in 2024 due to transportation issues, which are chronic. ACTH  and plasma cortisol were normal 02/2023  SUBJECTIVE:    Today (11/18/23): Marissa Doyle is here for a follow up on hypothyroidism.   Pt continues with weight gain  Continues with  occasional palpitations  Continues with mild tremors  Denies constipation or  diarrhea  Denies eye symptoms, has tearing of the eyes, follows with ophthalmology  No Biotin   Levothyroxine  175 mcg, 1 tablet daily     HISTORY:  Past Medical History:  Past Medical History:  Diagnosis Date   Abnormal Pap smear    Alcoholism (HCC)    Anemia    Chlamydia    Cholelithiasis    Ectopic pregnancy    Received MTX   Gallstones    Gonorrhea    Hepatic steatosis    Hx of adenomatous polyp of colon 07/22/2023   12 mm adenoma recall 2028    Hypertension    Hypothyroidism    due to hx radiation   Macrocytic anemia    Ovarian cyst    Pancreatitis    Trichomonas    Past Surgical History:  Past Surgical History:  Procedure Laterality Date   WISDOM TOOTH EXTRACTION Bilateral     Social History:  reports that she has been smoking cigarettes. She has a 2.5 pack-year smoking history. She has never used  smokeless tobacco. She reports that she does not currently use alcohol. She reports that she does not use drugs. Family History: family history includes Arthritis in her father; Colon cancer in her cousin; Crohn's disease in her cousin; Diabetes in her maternal aunt and mother; High Cholesterol in her mother; Hypertension in her mother and sister; Lupus in her sister.   HOME MEDICATIONS: Allergies as of 11/18/2023   No Known Allergies      Medication List        Accurate as of November 18, 2023  6:56 AM. If you have any questions, ask your nurse or doctor.          cetirizine 10 MG tablet Commonly known as: ZYRTEC SMARTSIG:1.0 Tablet(s) By Mouth Daily   ferrous gluconate 324 MG tablet Commonly known as: FERGON Take 1 tablet by mouth daily.   fluticasone 50 MCG/ACT nasal spray Commonly known as: FLONASE Place 1 spray into both nostrils daily.   J & J BURN CREAM EX Apply 1 packet topically 2 (two) times daily as needed (Burn relief).   levothyroxine  175 MCG tablet Commonly known as: SYNTHROID  Take 1 tablet (175 mcg total) by mouth daily.   losartan 25 MG tablet Commonly known as: COZAAR Take 25 mg by mouth daily.   Mirena (52 MG) 20 MCG/DAY Iud Generic  drug: levonorgestrel by Intrauterine route.   NIFEdipine 30 MG 24 hr tablet Commonly known as: ADALAT CC Take 30 mg by mouth daily.   Symbicort 80-4.5 MCG/ACT inhaler Generic drug: budesonide-formoterol Inhale into the lungs.   Ventolin HFA 108 (90 Base) MCG/ACT inhaler Generic drug: albuterol Inhale 2 puffs into the lungs every 4 (four) hours as needed.   Vitamin D (Ergocalciferol) 1.25 MG (50000 UNIT) Caps capsule Commonly known as: DRISDOL Take 50,000 Units by mouth once a week.          REVIEW OF SYSTEMS: A comprehensive ROS was conducted with the patient and is negative except as per HPI     OBJECTIVE:  VS: There were no vitals taken for this visit.   Wt Readings from Last 3 Encounters:   07/15/23 246 lb (111.6 kg)  06/03/23 246 lb 2 oz (111.6 kg)  05/18/23 243 lb (110.2 kg)     EXAM: General: Pt appears well and is in NAD  Eyes: Right eye proptosis noted   Neck: General: Supple without adenopathy. Thyroid : Thyroid  size normal.  No goiter or nodules appreciated.   Lungs: Clear with good BS bilat   Heart: Auscultation: RRR.  Extremities:  BL LE: No pretibial edema   Mental Status: Judgment, insight: Intact Orientation: Oriented to time, place, and person Mood and affect: No depression, anxiety, or agitation     DATA REVIEWED:     Latest Reference Range & Units 02/16/23 11:10  TSH mIU/L 0.39 (L)  T4,Free(Direct) 0.8 - 1.8 ng/dL 1.2  (L): Data is abnormally low  ASSESSMENT/PLAN/RECOMMENDATIONS:   Postablative hypothyroidism   -Patient is clinically euthyroid -No local neck symptoms -Patient continues with low TSH, I will decrease levothyroxine  as below  Medications :  levothyroxine  175 mcg daily   Follow-up in 6 months  Signed electronically by: Stefano Redgie Butts, MD  [facility]  [facility] [address] [place] [place], [place] [address] Phone: [phone] FAX: [phone]   CC: Macdonald Ping, NP [address] [place] [address] Phone: [phone] Fax: [phone]   Return to Endocrinology clinic as below: Future Appointments  Date Time Provider Department Center  11/18/2023  9:50 AM Nery Kalisz, Donell Redgie, MD [facility] None
